# Patient Record
Sex: Female | Born: 1935 | Race: White | Hispanic: No | Smoking: Former smoker
Health system: Southern US, Community
[De-identification: ages and names within clinical notes are randomized; demographics above are authoritative.]

## PROBLEM LIST (undated history)

## (undated) DIAGNOSIS — J449 Chronic obstructive pulmonary disease, unspecified: Secondary | ICD-10-CM

## (undated) DIAGNOSIS — M1990 Unspecified osteoarthritis, unspecified site: Secondary | ICD-10-CM

## (undated) DIAGNOSIS — J45909 Unspecified asthma, uncomplicated: Secondary | ICD-10-CM

## (undated) HISTORY — PX: TONSILLECTOMY: SUR1361

---
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
## 2018-02-25 DIAGNOSIS — F4322 Adjustment disorder with anxiety: Secondary | ICD-10-CM | POA: Insufficient documentation

## 2018-02-25 DIAGNOSIS — I10 Essential (primary) hypertension: Secondary | ICD-10-CM | POA: Insufficient documentation

## 2018-02-25 DIAGNOSIS — K219 Gastro-esophageal reflux disease without esophagitis: Secondary | ICD-10-CM | POA: Insufficient documentation

## 2019-05-21 ENCOUNTER — Emergency Department (HOSPITAL_COMMUNITY): Payer: Medicare Other

## 2019-05-21 ENCOUNTER — Other Ambulatory Visit: Payer: Self-pay

## 2019-05-21 ENCOUNTER — Encounter (HOSPITAL_COMMUNITY): Payer: Self-pay | Admitting: Emergency Medicine

## 2019-05-21 ENCOUNTER — Observation Stay (HOSPITAL_COMMUNITY)
Admission: EM | Admit: 2019-05-21 | Discharge: 2019-05-22 | Disposition: A | Discharge status: Home / Self Care | Payer: Medicare Other | Attending: Internal Medicine | Admitting: Internal Medicine

## 2019-05-21 DIAGNOSIS — J45901 Unspecified asthma with (acute) exacerbation: Secondary | ICD-10-CM | POA: Diagnosis not present

## 2019-05-21 DIAGNOSIS — Z1159 Encounter for screening for other viral diseases: Secondary | ICD-10-CM | POA: Insufficient documentation

## 2019-05-21 DIAGNOSIS — H9190 Unspecified hearing loss, unspecified ear: Secondary | ICD-10-CM | POA: Insufficient documentation

## 2019-05-21 DIAGNOSIS — Z8249 Family history of ischemic heart disease and other diseases of the circulatory system: Secondary | ICD-10-CM | POA: Insufficient documentation

## 2019-05-21 DIAGNOSIS — R42 Dizziness and giddiness: Secondary | ICD-10-CM | POA: Diagnosis present

## 2019-05-21 DIAGNOSIS — F4322 Adjustment disorder with anxiety: Secondary | ICD-10-CM | POA: Diagnosis not present

## 2019-05-21 DIAGNOSIS — J441 Chronic obstructive pulmonary disease with (acute) exacerbation: Secondary | ICD-10-CM | POA: Insufficient documentation

## 2019-05-21 DIAGNOSIS — R110 Nausea: Secondary | ICD-10-CM | POA: Insufficient documentation

## 2019-05-21 DIAGNOSIS — I10 Essential (primary) hypertension: Secondary | ICD-10-CM | POA: Insufficient documentation

## 2019-05-21 DIAGNOSIS — K219 Gastro-esophageal reflux disease without esophagitis: Secondary | ICD-10-CM | POA: Insufficient documentation

## 2019-05-21 DIAGNOSIS — Z87891 Personal history of nicotine dependence: Secondary | ICD-10-CM | POA: Diagnosis not present

## 2019-05-21 DIAGNOSIS — Z7951 Long term (current) use of inhaled steroids: Secondary | ICD-10-CM | POA: Insufficient documentation

## 2019-05-21 DIAGNOSIS — Z79899 Other long term (current) drug therapy: Secondary | ICD-10-CM | POA: Diagnosis not present

## 2019-05-21 HISTORY — DX: Unspecified asthma, uncomplicated: J45.909

## 2019-05-21 HISTORY — DX: Unspecified osteoarthritis, unspecified site: M19.90

## 2019-05-21 LAB — COMPREHENSIVE METABOLIC PANEL
ALT: 12 U/L (ref 0–44)
AST: 19 U/L (ref 15–41)
Albumin: 3.9 g/dL (ref 3.5–5.0)
Alkaline Phosphatase: 48 U/L (ref 38–126)
Anion gap: 11 (ref 5–15)
BUN: 15 mg/dL (ref 8–23)
CO2: 24 mmol/L (ref 22–32)
Calcium: 9.2 mg/dL (ref 8.9–10.3)
Chloride: 102 mmol/L (ref 98–111)
Creatinine, Ser: 0.92 mg/dL (ref 0.44–1.00)
GFR calc Af Amer: >60 mL/min (ref >60)
GFR calc non Af Amer: 57 mL/min — ABNORMAL LOW (ref >60)
Glucose, Bld: 122 mg/dL — ABNORMAL HIGH (ref 70–99)
Potassium: 3.7 mmol/L (ref 3.5–5.1)
Sodium: 137 mmol/L (ref 135–145)
Total Bilirubin: 0.6 mg/dL (ref 0.3–1.2)
Total Protein: 6.7 g/dL (ref 6.5–8.1)

## 2019-05-21 LAB — POCT I-STAT EG7
Bicarbonate: 24.4 mmol/L (ref 20.0–28.0)
Calcium, Ion: 1.23 mmol/L (ref 1.15–1.40)
HCT: 32 % — ABNORMAL LOW (ref 36.0–46.0)
Hemoglobin: 10.9 g/dL — ABNORMAL LOW (ref 12.0–15.0)
O2 Saturation: 86 %
Potassium: 3.8 mmol/L (ref 3.5–5.1)
Sodium: 139 mmol/L (ref 135–145)
TCO2: 26 mmol/L (ref 22–32)
pCO2, Ven: 39.8 mmHg — ABNORMAL LOW (ref 44.0–60.0)
pH, Ven: 7.395 (ref 7.250–7.430)
pO2, Ven: 52 mmHg — ABNORMAL HIGH (ref 32.0–45.0)

## 2019-05-21 LAB — CBC
HCT: 30.3 % — ABNORMAL LOW (ref 36.0–46.0)
Hemoglobin: 10 g/dL — ABNORMAL LOW (ref 12.0–15.0)
MCH: 33.3 pg (ref 26.0–34.0)
MCHC: 33 g/dL (ref 30.0–36.0)
MCV: 101 fL — ABNORMAL HIGH (ref 80.0–100.0)
Platelets: 164 10*3/uL (ref 150–400)
RBC: 3 MIL/uL — ABNORMAL LOW (ref 3.87–5.11)
RDW: 13.3 % (ref 11.5–15.5)
WBC: 10.7 10*3/uL — ABNORMAL HIGH (ref 4.0–10.5)
nRBC: 0 % (ref 0.0–0.2)

## 2019-05-21 LAB — TROPONIN I: Troponin I: <0.03 ng/mL (ref <0.03)

## 2019-05-21 LAB — BRAIN NATRIURETIC PEPTIDE: B Natriuretic Peptide: 95.2 pg/mL (ref 0.0–100.0)

## 2019-05-21 LAB — SARS CORONAVIRUS 2: SARS Coronavirus 2: NOT DETECTED

## 2019-05-21 MED ORDER — ALBUTEROL SULFATE HFA 108 (90 BASE) MCG/ACT IN AERS
4.0000 | INHALATION_SPRAY | Freq: Once | RESPIRATORY_TRACT | Status: AC
  Administered 2019-05-21: 4 via RESPIRATORY_TRACT
  Filled 2019-05-21: qty 6.7

## 2019-05-21 MED ORDER — METHYLPREDNISOLONE SODIUM SUCC 125 MG IJ SOLR
125.0000 mg | Freq: Once | INTRAMUSCULAR | Status: AC
  Administered 2019-05-21: 125 mg via INTRAVENOUS
  Filled 2019-05-21: qty 2

## 2019-05-21 MED ORDER — MECLIZINE HCL 25 MG PO TABS
25.0000 mg | ORAL_TABLET | Freq: Once | ORAL | Status: AC
  Administered 2019-05-21: 21:00:00 25 mg via ORAL
  Filled 2019-05-21: qty 1

## 2019-05-21 MED ORDER — AEROCHAMBER PLUS FLO-VU MISC
1.0000 | Freq: Once | Status: AC
  Administered 2019-05-22: 1
  Filled 2019-05-21: qty 1

## 2019-05-21 NOTE — ED Triage Notes (Signed)
Pt to ED via [facility] after reported having sudden onset of dizziness and nausea.  Pt st's she ate dinner then went next door to her neighbors where she became dizzy and nauseated.  Pt has had dry heaves but has not vomited.  EMS gave pt Zofran 4mg  IV

## 2019-05-21 NOTE — ED Provider Notes (Signed)
Medical screening examination/treatment/procedure(s) were conducted as a shared visit with non-physician practitioner(s) and myself.  I personally evaluated the patient during the encounter.  EKG Interpretation  Date/Time:  Saturday May 21 2019 19:52:22 EDT Ventricular Rate:  77 PR Interval:    QRS Duration: 100 QT Interval:  410 QTC Calculation: 464 R Axis:   55 Text Interpretation:  Sinus rhythm NORMAL, NO OLD COMPARISON Confirmed by Charlesetta Shanks [phone]) on 05/21/2019 8:32:13 PM  Positional dizziness with nausea sudden onset, worse with position change, no H/A and no visual disturbance.  PE normal neuro exam, alert and no distress. Heart regular, lungs occasional wheeze with no resp distress.  Agree with plan.   Charlesetta Shanks, MD 05/28/19 [phone]

## 2019-05-21 NOTE — ED Provider Notes (Signed)
[facility] EMERGENCY DEPARTMENT Provider Note   CSN: [id number] Arrival date & time: 05/21/19  1948    History   Chief Complaint Chief Complaint  Patient presents with  . Nausea  . Dizziness    HPI Zyanya Wormald is a 85 y.o. female.     [place] is a 85 y.o. female with a history of COPD, asthma and arthritis, who presents via EMS for evaluation of dizziness and nausea.  Patient reports that she walked across the street to have a drink with her neighbor after dinner, she went to stand up from her chair at her neighbor's house and had sudden onset of dizziness, when she tried to stand she fell back into her chair 3 times, did not hit her head or injure herself.  She reports with onset of dizziness he also felt incredibly nauseous, she has had dry heaving but no episodes of vomiting, Zofran given with EMS.  She reports dizziness is made worse with certain position changes, and when she looks down dizziness seems to be improved.  She reports she felt off balance but also somewhat lightheaded.  Denies associated chest pain or shortness of breath, has not had lower extremity swelling.  She denies any abdominal pain associated with nausea, no diarrhea or blood in her stools.  She has not had any fevers or chills or recent illness.  Reports she has some element of chronic shortness of breath related to her COPD and asthma and has a chronic occasional cough which is unchanged.  No known sick contacts.  Patient does not wear oxygen chronically at home.  EMS placed patient on oxygen to help with nausea and dizziness primarily, this was not due to hypoxic episode.  Patient has not taken anything to treat her symptoms prior to arrival aside from Zofran given with EMS.  Denies prior history of similar dizziness episodes.  Denies associated headache, vision changes, facial asymmetry, numbness weakness or tingling in any extremities.     Past Medical History:  Diagnosis  Date  . Arthritis   . Asthma   . COPD (chronic obstructive pulmonary disease) [facility])     Patient Active Problem List   Diagnosis Date Noted  . Acute asthma exacerbation 05/21/2019  . Adjustment disorder with anxious mood 02/25/2018  . Essential hypertension 02/25/2018  . GERD (gastroesophageal reflux disease) 02/25/2018    Past Surgical History:  Procedure Laterality Date  . TONSILLECTOMY       OB History   No obstetric history on file.      Home Medications    Prior to Admission medications   Medication Sig Start Date End Date Taking? Authorizing Provider  meclizine (ANTIVERT) 25 MG tablet Take 1 tablet (25 mg total) by mouth 3 (three) times daily as needed for dizziness. 05/22/19   Dartha Lodge, PA-C  predniSONE (DELTASONE) 20 MG tablet Take 3 tablets (60 mg total) by mouth daily for 5 days. 05/22/19 05/27/19  Dartha Lodge, PA-C    Family History Family History  Problem Relation Age of Onset  . Heart disease Mother   . Stroke Father     Social History Social History   Tobacco Use  . Smoking status: Former Games developer  . Smokeless tobacco: Never Used  Substance Use Topics  . Alcohol use: Yes  . Drug use: Never     Allergies   Patient has no allergy information on record.   Review of Systems Review of Systems  Constitutional: Negative for chills  and fever.  HENT: Negative.   Eyes: Negative for pain and visual disturbance.  Respiratory: Negative for cough and shortness of breath.   Cardiovascular: Negative for chest pain and leg swelling.  Gastrointestinal: Positive for nausea. Negative for abdominal pain, constipation, diarrhea and vomiting.  Genitourinary: Negative for dysuria and frequency.  Musculoskeletal: Negative for arthralgias, back pain, myalgias and neck pain.  Skin: Negative for color change and rash.  Neurological: Positive for dizziness and light-headedness. Negative for seizures, syncope, facial asymmetry, speech difficulty, weakness,  numbness and headaches.     Physical Exam Updated Vital Signs BP (!) 133/51 (BP Location: Right Arm)   Pulse 81   Temp (!) 97.1 F (36.2 C) (Axillary)   Resp (!) 28   Ht 5' (1.524 m)   Wt 59 kg   SpO2 98%   BMI 25.39 kg/m   Physical Exam Vitals signs and nursing note reviewed.  Constitutional:      General: She is not in acute distress.    Appearance: Normal appearance. She is well-developed and normal weight. She is not ill-appearing or diaphoretic.  HENT:     Head: Normocephalic and atraumatic.     Nose: Nose normal.     Mouth/Throat:     Mouth: Mucous membranes are moist.     Pharynx: Oropharynx is clear.  Eyes:     General:        Right eye: No discharge.        Left eye: No discharge.     Extraocular Movements: Extraocular movements intact.     Pupils: Pupils are equal, round, and reactive to light.     Comments: No nystagmus noted.  Neck:     Musculoskeletal: Neck supple.  Cardiovascular:     Rate and Rhythm: Normal rate and regular rhythm.     Pulses: Normal pulses.          Radial pulses are 2+ on the right side and 2+ on the left side.       Dorsalis pedis pulses are 2+ on the right side and 2+ on the left side.     Heart sounds: Normal heart sounds. No murmur. No friction rub. No gallop.   Pulmonary:     Effort: Pulmonary effort is normal. No respiratory distress.     Breath sounds: Wheezing present. No rales.     Comments: Patient breathing comfortably on room air completing full sentences, on auscultation patient does have some wheezing present in bilateral lung fields. Abdominal:     General: Bowel sounds are normal. There is no distension.     Palpations: Abdomen is soft. There is no mass.     Tenderness: There is no abdominal tenderness. There is no guarding.     Comments: Abdomen soft, nondistended, nontender to palpation in all quadrants without guarding or peritoneal signs  Musculoskeletal:        General: No deformity.     Right lower leg: No  edema.     Left lower leg: No edema.     Comments: Bilateral lower extremities warm and well-perfused without edema or tenderness.  Skin:    General: Skin is warm and dry.     Capillary Refill: Capillary refill takes less than 2 seconds.  Neurological:     Mental Status: She is alert.     Coordination: Coordination normal.     Comments: Speech is clear, able to follow commands CN III-XII intact Normal strength in upper and lower extremities bilaterally including dorsiflexion and  plantar flexion, strong and equal grip strength Sensation normal to light and sharp touch Moves extremities without ataxia, coordination intact Normal finger to nose and rapid alternating movements No pronator drift  Psychiatric:        Mood and Affect: Mood normal.        Behavior: Behavior normal.      ED Treatments / Results  Labs (all labs ordered are listed, but only abnormal results are displayed) Labs Reviewed  COMPREHENSIVE METABOLIC PANEL - Abnormal; Notable for the following components:      Result Value   Glucose, Bld 122 (*)    GFR calc non Af Amer 57 (*)    All other components within normal limits  CBC - Abnormal; Notable for the following components:   WBC 10.7 (*)    RBC 3.00 (*)    Hemoglobin 10.0 (*)    HCT 30.3 (*)    MCV 101.0 (*)    All other components within normal limits  POCT I-STAT EG7 - Abnormal; Notable for the following components:   pCO2, Ven 39.8 (*)    pO2, Ven 52.0 (*)    HCT 32.0 (*)    Hemoglobin 10.9 (*)    All other components within normal limits  SARS CORONAVIRUS 2  TROPONIN I  BRAIN NATRIURETIC PEPTIDE    EKG EKG Interpretation  Date/Time:  Saturday May 21 2019 19:52:22 EDT Ventricular Rate:  77 PR Interval:    QRS Duration: 100 QT Interval:  410 QTC Calculation: 464 R Axis:   55 Text Interpretation:  Sinus rhythm NORMAL, NO OLD COMPARISON Confirmed by Charlesetta Shanks [phone]) on 05/21/2019 8:32:13 PM   Radiology Dg Chest Port 1 View   Result Date: 05/21/2019 CLINICAL DATA:  Hypoxia EXAM: PORTABLE CHEST 1 VIEW COMPARISON:  None. FINDINGS: The heart size is borderline enlarged. There is blunting of the costophrenic angles bilaterally. There are vague bibasilar airspace opacities. The lungs appear somewhat hyperexpanded. There is no pneumothorax. IMPRESSION: 1. Borderline enlarged heart. 2. Blunting of the costophrenic angles bilaterally may be secondary to trace bilateral pleural effusions and/or atelectasis. Electronically Signed   By: Constance Holster M.D.   On: 05/21/2019 21:18    Procedures Procedures (including critical care time)  Medications Ordered in ED Medications  aerochamber plus with mask device 1 each (has no administration in time range)  meclizine (ANTIVERT) tablet 25 mg (25 mg Oral Given 05/21/19 2119)  albuterol (VENTOLIN HFA) 108 (90 Base) MCG/ACT inhaler 4 puff (4 puffs Inhalation Given 05/21/19 2119)  methylPREDNISolone sodium succinate (SOLU-MEDROL) 125 mg/2 mL injection 125 mg (125 mg Intravenous Given 05/21/19 2121)     Initial Impression / Assessment and Plan / ED Course  I have reviewed the triage vital signs and the nursing notes.  Pertinent labs & imaging results that were available during my care of the patient were reviewed by me and considered in my medical decision making (see chart for details).  Patient presents to the emergency department for evaluation of dizziness and nausea, symptoms were sudden onset, dizziness is worse with position change.  Patient has no other associated neurologic symptoms and normal neurologic exam without deficit on arrival.  No prior history of stroke or vertigo.  No associated chest pain or shortness of breath, no lower extremity edema.  No abdominal pain, no melena or hematochezia.  Symptoms may be positional vertigo versus near syncope.  Given reassuring neurologic exam do not feel that MRI is indicated at this time.  Will give IV fluids  and meclizine and  reevaluate.  Patient does have some slight wheezing on exam, history of COPD and asthma.  Just after evaluating patient was notified by nursing that patient desatted to 70%, I witnessed this myself, with good flow on pulse ox.  Patient reported that she felt a little bit short of breath and lightheaded with this, became slightly tachypneic.  Patient continues to have some wheezing on exam.  Patient placed on 2.5 L nasal cannula with significant improvement in O2 saturations, now greater than 90%, will give nebulizer treatments and Solu-Medrol and reevaluate.  We will also check labs, VBG, troponin, BNP, chest x-ray and EKG.   VBG reassuring patient is not significantly hypercarbic with normal pH.  EKG without ischemic changes and troponin negative.  Slight leukocytosis 10.7, stable hemoglobin, no acute electrolyte derangements, normal renal and liver function.  BNP is not elevated.  Coronavirus test negative.  Chest x-ray shows borderline cardiomegaly, there is some blunting of the costophrenic angles which could be related to atelectasis versus trace pleural effusions, patient does not have signs of significant fluid overload on exam.  Initially consult Dr. Allena Katz with [facility] for admission given dizziness versus near syncope and hypoxia, he saw and evaluated patient in consult, patient was taken off of oxygen and seems to be maintaining appropriate O2 saturations on room air and dizziness has resolved with meclizine.  As long as patient is ambulatory in the department with stable oxygen saturations feel she can likely go home with outpatient treatment and PCP follow-up.  Will ambulate patient, I feel this plan is reasonable.  Patient ambulated in the department maintaining O2 saturation greater than 93% and reports she is feeling much better, dizziness has resolved, no further nausea.  Likely BPPV, remains neurologically intact.  Lungs are clear after steroids and albuterol.  Patient is in  agreement with plan for discharge home, will send patient home on steroid burst, meclizine as needed for dizziness, strict return precautions discussed and she expresses understanding and agreement with this plan.  Discharged home in good condition.  Final Clinical Impressions(s) / ED Diagnoses   Final diagnoses:  COPD with acute exacerbation (HCC)  Dizziness    ED Discharge Orders         Ordered    predniSONE (DELTASONE) 20 MG tablet  Daily     05/22/19 0035    meclizine (ANTIVERT) 25 MG tablet  3 times daily PRN     05/22/19 0035           Dartha Lodge, PA-C 05/24/19 1536    Arby Barrette, MD 05/28/19 [phone]

## 2019-05-21 NOTE — Consult Note (Signed)
History and Physical    Theresa Landry ZOX:[id number] DOB: 08/15/1933 DOA: 05/21/2019  PCP: Loyal Jacobson, MD  Patient coming from: Friend's house  I have personally briefly reviewed patient's old medical records in [facility]  Chief Complaint: Dizziness and nausea  HPI: Theresa Landry is a 85 y.o. female with medical history significant for asthma, chronic bronchitis and bronchiectasis, tension, GERD, and adjustment disorder with anxious mood who presents to the ED for evaluation of dizziness and nausea.  Patient states she was sitting down at her friend's house when she stood up quickly and all of a sudden had dizziness described as a feeling of off balance with a room spinning sensation.  She also reported feeling nauseous but did not have emesis, just dry heaves.  The symptoms were transient and have not recurred after receiving Zofran by EMS and meclizine in the ED.  She reports chronic asthma/COPD and bronchitis which is unchanged.  She denies any chest pain, palpitations, loss of consciousness, abdominal pain, dysuria, or fall.  ED Course:  Initial vitals showed BP 133/51, pulse 80, RR 20, temp 97.1 Fahrenheit, SPO2 reportedly decreased to 70s on room air per EDP (not documented under vitals) and improved with 2 L supplemental O2 via [place].  Labs are notable for WBC 10.7, hemoglobin 10.0, platelets 164,000, potassium 3.7, BUN 15, creatinine 0.92, serum glucose 122, BNP 95.2, troponin I <0.03.  VBG showed pH 7.395, PCO2 39.8, PO2 52.  SARS-CoV-2 test was obtained and negative.  Portable chest x-ray showed slight blunting of the costophrenic angles bilaterally suggestive of atelectasis versus trace pleural effusion, no focal consolidation or pulmonary edema noted.  Patient was given oral meclizine, albuterol inhaler, and IV Solu-Medrol 125 mg once with continued supplemental oxygen requirement.  The hospitalist service was consulted to admit for further management.  Review of  Systems: All systems reviewed and are negative except as documented in history of present illness above.   Past Medical History:  Diagnosis Date  . Arthritis   . Asthma   . COPD (chronic obstructive pulmonary disease) (HCC)     Past Surgical History:  Procedure Laterality Date  . TONSILLECTOMY      Social History:  reports that she has quit smoking. She has never used smokeless tobacco. She reports current alcohol use. She reports that she does not use drugs.  Not on File  Family History  Problem Relation Age of Onset  . Heart disease Mother   . Stroke Father      Prior to Admission medications   Not on File    Physical Exam: Vitals:   05/21/19 1949 05/21/19 1955 05/21/19 1957  BP:  (!) 133/51   Pulse:  81   Resp:  (!) 28   Temp: (!) 97.5 F (36.4 C) (!) 97.1 F (36.2 C)   TempSrc: Axillary Axillary   SpO2:  98%   Weight:   59 kg  Height:   5' (1.524 m)    Constitutional: Elderly woman resting supine in bed, NAD, calm, comfortable.  Somewhat hard of hearing. Eyes: PERRL, lids and conjunctivae normal. EOMI without nystagmus. ENMT: Mucous membranes are moist. Posterior pharynx clear of any exudate or lesions.Normal dentition.  Neck: normal, supple, no masses. Respiratory: Distant breath sounds with faint expiratory wheezing on exam. Normal respiratory effort. No accessory muscle use.  Cardiovascular: Regular rate and rhythm, no murmurs / rubs / gallops. No extremity edema. 2+ pedal pulses. Abdomen: no tenderness, no masses palpated. No hepatosplenomegaly. Bowel sounds positive.  Musculoskeletal: no clubbing / cyanosis. No joint deformity upper and lower extremities. Good ROM, no contractures. Normal muscle tone.  Skin: no rashes, lesions, ulcers. No induration Neurologic: CN 2-12 grossly intact. Sensation intact, Strength 5/5 in all 4.  No nystagmus or dizziness elicited with provoking maneuver. Psychiatric: Normal judgment and insight. Alert and oriented x 3.  Normal mood.   Labs on Admission: I have personally reviewed following labs and imaging studies  CBC: Recent Labs  Lab 05/21/19 2055 05/21/19 2133  WBC 10.7*  --   HGB 10.0* 10.9*  HCT 30.3* 32.0*  MCV 101.0*  --   PLT 164  --    Basic Metabolic Panel: Recent Labs  Lab 05/21/19 2055 05/21/19 2133  NA 137 139  K 3.7 3.8  CL 102  --   CO2 24  --   GLUCOSE 122*  --   BUN 15  --   CREATININE 0.92  --   CALCIUM 9.2  --    GFR: Estimated Creatinine Clearance: 36.6 mL/min (by C-G formula based on SCr of 0.92 mg/dL). Liver Function Tests: Recent Labs  Lab 05/21/19 2055  AST 19  ALT 12  ALKPHOS 48  BILITOT 0.6  PROT 6.7  ALBUMIN 3.9   No results for input(s): LIPASE, AMYLASE in the last 168 hours. No results for input(s): AMMONIA in the last 168 hours. Coagulation Profile: No results for input(s): INR, PROTIME in the last 168 hours. Cardiac Enzymes: Recent Labs  Lab 05/21/19 2055  TROPONINI <0.03   BNP (last 3 results) No results for input(s): PROBNP in the last 8760 hours. HbA1C: No results for input(s): HGBA1C in the last 72 hours. CBG: No results for input(s): GLUCAP in the last 168 hours. Lipid Profile: No results for input(s): CHOL, HDL, LDLCALC, TRIG, CHOLHDL, LDLDIRECT in the last 72 hours. Thyroid Function Tests: No results for input(s): TSH, T4TOTAL, FREET4, T3FREE, THYROIDAB in the last 72 hours. Anemia Panel: No results for input(s): VITAMINB12, FOLATE, FERRITIN, TIBC, IRON, RETICCTPCT in the last 72 hours. Urine analysis: No results found for: COLORURINE, APPEARANCEUR, LABSPEC, [place], GLUCOSEU, HGBUR, BILIRUBINUR, KETONESUR, PROTEINUR, UROBILINOGEN, NITRITE, LEUKOCYTESUR  Radiological Exams on Admission: Dg Chest Port 1 View  Result Date: 05/21/2019 CLINICAL DATA:  Hypoxia EXAM: PORTABLE CHEST 1 VIEW COMPARISON:  None. FINDINGS: The heart size is borderline enlarged. There is blunting of the costophrenic angles bilaterally. There are vague  bibasilar airspace opacities. The lungs appear somewhat hyperexpanded. There is no pneumothorax. IMPRESSION: 1. Borderline enlarged heart. 2. Blunting of the costophrenic angles bilaterally may be secondary to trace bilateral pleural effusions and/or atelectasis. Electronically Signed   By: Constance Holster M.D.   On: 05/21/2019 21:18    EKG: Independently reviewed.  Normal sinus rhythm, no acute ischemic changes, no prior for comparison.  Assessment/Plan Active Problems:   Acute asthma exacerbation   Adjustment disorder with anxious mood   Essential hypertension   GERD (gastroesophageal reflux disease)  Theresa Landry is a 85 y.o. female with medical history significant for asthma, chronic bronchitis and bronchiectasis, tension, GERD, and adjustment disorder with anxious mood who presented to the ED with transient episode of dizziness on standing and nausea without emesis.  Dizziness and nausea: Transient episode at home without loss of consciousness or fall.  Symptoms improved after receiving Zofran and meclizine.  She has no focal neuro deficits on my examination or nystagmus.  No dizziness or nystagmus elicited with provoking maneuvers.  She is currently feeling better and feels comfortable returning home.  At this time I do not see an indication for admission.  Can consider checking orthostatic vital signs and ambulating with pulse ox for further evaluation.  Asthma: Patient with asthma and chronic bronchitis following with [facility] pulmonology.  She uses Symbicort, Singulair, and as needed duo nebs and albuterol inhalers and denies any recent worsening shortness of breath.  She reportedly was hypoxic with saturation in the 70s on room air in the ED.  She received IV Solu-Medrol 125 mg once and an albuterol inhaler treatment.  On my examination she was saturating >96% on room air after initial treatment without further respiratory symptoms.  As above, recommend  ambulating with pulse ox to determine oxygenation status.  Otherwise, at this time, there is no indication to admit.  Patient feels comfortable returning home and continuing her home asthma regimen.   Darreld Mclean MD [facility]  If 7PM-7AM, please contact night-coverage [email or web address]  05/21/2019, 11:32 PM

## 2019-05-22 DIAGNOSIS — R42 Dizziness and giddiness: Secondary | ICD-10-CM | POA: Diagnosis not present

## 2019-05-22 MED ORDER — PREDNISONE 20 MG PO TABS: 60.0000 mg | ORAL_TABLET | Freq: Every day | ORAL | 0 refills | Status: AC

## 2019-05-22 MED ORDER — MECLIZINE HCL 25 MG PO TABS: 25.0000 mg | ORAL_TABLET | Freq: Three times a day (TID) | ORAL | 0 refills | Status: AC | PRN

## 2019-05-22 NOTE — ED Notes (Signed)
Pt attempting to find a ride home at this time.

## 2019-05-22 NOTE — ED Notes (Signed)
Pt ambulated in the hall with sat's 93-94%

## 2019-05-22 NOTE — Discharge Instructions (Addendum)
Please take prednisone for the next 5 days to help treat your asthma and COPD.  Use your albuterol inhaler every 4 hours for the next 24 hours and then as needed and take all your other COPD and asthmatic medications as directed.  Dizziness please take meclizine as needed.  If dizziness persists, you have persistent vomiting, develop headache, vision changes, numbness or weakness please return to the emergency department.  If you have increasing shortness of breath, chest pain, fevers or any other new or concerning symptoms return to the ED.

## 2020-07-09 ENCOUNTER — Inpatient Hospital Stay (HOSPITAL_COMMUNITY)
Admission: EM | Admit: 2020-07-09 | Discharge: 2020-07-17 | DRG: 041 | Disposition: A | Discharge status: Skilled Nursing Facility | Payer: Medicare Other | Attending: Student in an Organized Health Care Education/Training Program | Admitting: Student in an Organized Health Care Education/Training Program

## 2020-07-09 ENCOUNTER — Other Ambulatory Visit: Payer: Self-pay

## 2020-07-09 ENCOUNTER — Emergency Department (HOSPITAL_COMMUNITY): Payer: Medicare Other

## 2020-07-09 ENCOUNTER — Encounter (HOSPITAL_COMMUNITY): Payer: Self-pay | Admitting: Emergency Medicine

## 2020-07-09 DIAGNOSIS — I10 Essential (primary) hypertension: Secondary | ICD-10-CM | POA: Diagnosis present

## 2020-07-09 DIAGNOSIS — G9349 Other encephalopathy: Secondary | ICD-10-CM | POA: Diagnosis present

## 2020-07-09 DIAGNOSIS — I63412 Cerebral infarction due to embolism of left middle cerebral artery: Principal | ICD-10-CM | POA: Diagnosis present

## 2020-07-09 DIAGNOSIS — H5347 Heteronymous bilateral field defects: Secondary | ICD-10-CM | POA: Diagnosis present

## 2020-07-09 DIAGNOSIS — K219 Gastro-esophageal reflux disease without esophagitis: Secondary | ICD-10-CM | POA: Diagnosis present

## 2020-07-09 DIAGNOSIS — R29707 NIHSS score 7: Secondary | ICD-10-CM | POA: Diagnosis present

## 2020-07-09 DIAGNOSIS — Z79899 Other long term (current) drug therapy: Secondary | ICD-10-CM | POA: Diagnosis not present

## 2020-07-09 DIAGNOSIS — M1990 Unspecified osteoarthritis, unspecified site: Secondary | ICD-10-CM | POA: Diagnosis present

## 2020-07-09 DIAGNOSIS — H903 Sensorineural hearing loss, bilateral: Secondary | ICD-10-CM | POA: Diagnosis present

## 2020-07-09 DIAGNOSIS — F419 Anxiety disorder, unspecified: Secondary | ICD-10-CM | POA: Diagnosis present

## 2020-07-09 DIAGNOSIS — Z823 Family history of stroke: Secondary | ICD-10-CM

## 2020-07-09 DIAGNOSIS — Z7951 Long term (current) use of inhaled steroids: Secondary | ICD-10-CM

## 2020-07-09 DIAGNOSIS — G8191 Hemiplegia, unspecified affecting right dominant side: Secondary | ICD-10-CM | POA: Diagnosis present

## 2020-07-09 DIAGNOSIS — I440 Atrioventricular block, first degree: Secondary | ICD-10-CM | POA: Diagnosis present

## 2020-07-09 DIAGNOSIS — I63512 Cerebral infarction due to unspecified occlusion or stenosis of left middle cerebral artery: Secondary | ICD-10-CM | POA: Diagnosis not present

## 2020-07-09 DIAGNOSIS — Z8249 Family history of ischemic heart disease and other diseases of the circulatory system: Secondary | ICD-10-CM

## 2020-07-09 DIAGNOSIS — R4701 Aphasia: Secondary | ICD-10-CM | POA: Diagnosis present

## 2020-07-09 DIAGNOSIS — Z20822 Contact with and (suspected) exposure to covid-19: Secondary | ICD-10-CM | POA: Diagnosis present

## 2020-07-09 DIAGNOSIS — R410 Disorientation, unspecified: Secondary | ICD-10-CM | POA: Diagnosis not present

## 2020-07-09 DIAGNOSIS — M069 Rheumatoid arthritis, unspecified: Secondary | ICD-10-CM | POA: Diagnosis present

## 2020-07-09 DIAGNOSIS — Z79890 Hormone replacement therapy (postmenopausal): Secondary | ICD-10-CM

## 2020-07-09 DIAGNOSIS — Z8673 Personal history of transient ischemic attack (TIA), and cerebral infarction without residual deficits: Secondary | ICD-10-CM

## 2020-07-09 DIAGNOSIS — Z87891 Personal history of nicotine dependence: Secondary | ICD-10-CM

## 2020-07-09 DIAGNOSIS — I480 Paroxysmal atrial fibrillation: Secondary | ICD-10-CM | POA: Diagnosis present

## 2020-07-09 DIAGNOSIS — J449 Chronic obstructive pulmonary disease, unspecified: Secondary | ICD-10-CM | POA: Diagnosis present

## 2020-07-09 DIAGNOSIS — R569 Unspecified convulsions: Secondary | ICD-10-CM | POA: Diagnosis not present

## 2020-07-09 DIAGNOSIS — E039 Hypothyroidism, unspecified: Secondary | ICD-10-CM | POA: Diagnosis present

## 2020-07-09 DIAGNOSIS — I639 Cerebral infarction, unspecified: Secondary | ICD-10-CM | POA: Diagnosis present

## 2020-07-09 DIAGNOSIS — I629 Nontraumatic intracranial hemorrhage, unspecified: Secondary | ICD-10-CM | POA: Diagnosis present

## 2020-07-09 DIAGNOSIS — I619 Nontraumatic intracerebral hemorrhage, unspecified: Secondary | ICD-10-CM | POA: Diagnosis present

## 2020-07-09 DIAGNOSIS — E785 Hyperlipidemia, unspecified: Secondary | ICD-10-CM | POA: Diagnosis present

## 2020-07-09 DIAGNOSIS — I6389 Other cerebral infarction: Secondary | ICD-10-CM | POA: Diagnosis not present

## 2020-07-09 DIAGNOSIS — G3184 Mild cognitive impairment, so stated: Secondary | ICD-10-CM | POA: Diagnosis present

## 2020-07-09 LAB — COMPREHENSIVE METABOLIC PANEL
ALT: 12 U/L (ref 0–44)
AST: 18 U/L (ref 15–41)
Albumin: 3.8 g/dL (ref 3.5–5.0)
Alkaline Phosphatase: 57 U/L (ref 38–126)
Anion gap: 10 (ref 5–15)
BUN: 11 mg/dL (ref 8–23)
CO2: 23 mmol/L (ref 22–32)
Calcium: 9.5 mg/dL (ref 8.9–10.3)
Chloride: 103 mmol/L (ref 98–111)
Creatinine, Ser: 0.72 mg/dL (ref 0.44–1.00)
GFR calc Af Amer: >60 mL/min (ref >60)
GFR calc non Af Amer: >60 mL/min (ref >60)
Glucose, Bld: 103 mg/dL — ABNORMAL HIGH (ref 70–99)
Potassium: 3.8 mmol/L (ref 3.5–5.1)
Sodium: 136 mmol/L (ref 135–145)
Total Bilirubin: 1.1 mg/dL (ref 0.3–1.2)
Total Protein: 7.4 g/dL (ref 6.5–8.1)

## 2020-07-09 LAB — POCT I-STAT, CHEM 8
BUN: 11 mg/dL (ref 8–23)
Calcium, Ion: 1.15 mmol/L (ref 1.15–1.40)
Chloride: 102 mmol/L (ref 98–111)
Creatinine, Ser: 0.6 mg/dL (ref 0.44–1.00)
Glucose, Bld: 101 mg/dL — ABNORMAL HIGH (ref 70–99)
HCT: 38 % (ref 36.0–46.0)
Hemoglobin: 12.9 g/dL (ref 12.0–15.0)
Potassium: 4 mmol/L (ref 3.5–5.1)
Sodium: 139 mmol/L (ref 135–145)
TCO2: 23 mmol/L (ref 22–32)

## 2020-07-09 LAB — DIFFERENTIAL
Abs Immature Granulocytes: 0.02 10*3/uL (ref 0.00–0.07)
Basophils Absolute: 0.1 10*3/uL (ref 0.0–0.1)
Basophils Relative: 1 %
Eosinophils Absolute: 1 10*3/uL — ABNORMAL HIGH (ref 0.0–0.5)
Eosinophils Relative: 10 %
Immature Granulocytes: 0 %
Lymphocytes Relative: 38 %
Lymphs Abs: 3.9 10*3/uL (ref 0.7–4.0)
Monocytes Absolute: 0.7 10*3/uL (ref 0.1–1.0)
Monocytes Relative: 7 %
Neutro Abs: 4.4 10*3/uL (ref 1.7–7.7)
Neutrophils Relative %: 44 %

## 2020-07-09 LAB — CBC
HCT: 36.7 % (ref 36.0–46.0)
Hemoglobin: 11.9 g/dL — ABNORMAL LOW (ref 12.0–15.0)
MCH: 33.1 pg (ref 26.0–34.0)
MCHC: 32.4 g/dL (ref 30.0–36.0)
MCV: 101.9 fL — ABNORMAL HIGH (ref 80.0–100.0)
Platelets: 225 10*3/uL (ref 150–400)
RBC: 3.6 MIL/uL — ABNORMAL LOW (ref 3.87–5.11)
RDW: 14.3 % (ref 11.5–15.5)
WBC: 10.2 10*3/uL (ref 4.0–10.5)
nRBC: 0 % (ref 0.0–0.2)

## 2020-07-09 LAB — PROTIME-INR
INR: 1.1 (ref 0.8–1.2)
Prothrombin Time: 13.4 seconds (ref 11.4–15.2)

## 2020-07-09 LAB — APTT: aPTT: 27 seconds (ref 24–36)

## 2020-07-09 LAB — SARS CORONAVIRUS 2 BY RT PCR (HOSPITAL ORDER, PERFORMED IN ~~LOC~~ HOSPITAL LAB): SARS Coronavirus 2: NEGATIVE

## 2020-07-09 LAB — CBG MONITORING, ED: Glucose-Capillary: 89 mg/dL (ref 70–99)

## 2020-07-09 MED ORDER — IOHEXOL 350 MG/ML SOLN
100.0000 mL | Freq: Once | INTRAVENOUS | Status: AC | PRN
  Administered 2020-07-09: 100 mL via INTRAVENOUS

## 2020-07-09 MED ORDER — LEVOTHYROXINE SODIUM 25 MCG PO TABS
25.0000 ug | ORAL_TABLET | Freq: Every morning | ORAL | Status: DC
  Administered 2020-07-11 – 2020-07-17 (×7): 25 ug via ORAL
  Filled 2020-07-09 (×8): qty 1

## 2020-07-09 MED ORDER — LACTATED RINGERS IV BOLUS
500.0000 mL | Freq: Once | INTRAVENOUS | Status: AC
  Administered 2020-07-09: 500 mL via INTRAVENOUS

## 2020-07-09 MED ORDER — DILTIAZEM HCL ER COATED BEADS 240 MG PO CP24: 240.0000 mg | ORAL_CAPSULE | Freq: Every day | ORAL | Status: DC

## 2020-07-09 MED ORDER — MOMETASONE FURO-FORMOTEROL FUM 200-5 MCG/ACT IN AERO
2.0000 | INHALATION_SPRAY | Freq: Two times a day (BID) | RESPIRATORY_TRACT | Status: DC
  Administered 2020-07-10 – 2020-07-17 (×14): 2 via RESPIRATORY_TRACT
  Filled 2020-07-09: qty 8.8

## 2020-07-09 MED ORDER — SENNOSIDES-DOCUSATE SODIUM 8.6-50 MG PO TABS
1.0000 | ORAL_TABLET | Freq: Every evening | ORAL | Status: DC | PRN
  Administered 2020-07-15: 1 via ORAL
  Filled 2020-07-09: qty 1

## 2020-07-09 MED ORDER — MECLIZINE HCL 12.5 MG PO TABS: 25.0000 mg | ORAL_TABLET | Freq: Three times a day (TID) | ORAL | Status: DC | PRN

## 2020-07-09 MED ORDER — ONDANSETRON HCL 4 MG PO TABS: 4.0000 mg | ORAL_TABLET | Freq: Four times a day (QID) | ORAL | Status: DC | PRN

## 2020-07-09 MED ORDER — SODIUM CHLORIDE 0.9% FLUSH
3.0000 mL | Freq: Once | INTRAVENOUS | Status: AC
Start: 2020-07-09 — End: 2020-07-09
  Administered 2020-07-09: 3 mL via INTRAVENOUS

## 2020-07-09 MED ORDER — KCL-LACTATED RINGERS-D5W 20 MEQ/L IV SOLN
INTRAVENOUS | Status: AC
  Filled 2020-07-09 (×2): qty 1000

## 2020-07-09 MED ORDER — ONDANSETRON HCL 4 MG/2ML IJ SOLN: 4.0000 mg | Freq: Four times a day (QID) | INTRAMUSCULAR | Status: DC | PRN

## 2020-07-09 MED ORDER — PANTOPRAZOLE SODIUM 20 MG PO TBEC
20.0000 mg | DELAYED_RELEASE_TABLET | Freq: Every day | ORAL | Status: DC
  Administered 2020-07-10 – 2020-07-17 (×8): 20 mg via ORAL
  Filled 2020-07-09 (×8): qty 1

## 2020-07-09 MED ORDER — ENOXAPARIN SODIUM 40 MG/0.4ML ~~LOC~~ SOLN: 40.0000 mg | SUBCUTANEOUS | Status: DC

## 2020-07-09 MED ORDER — ACETAMINOPHEN 650 MG RE SUPP: 650.0000 mg | Freq: Four times a day (QID) | RECTAL | Status: DC | PRN

## 2020-07-09 MED ORDER — ACETAMINOPHEN 325 MG PO TABS: 650.0000 mg | ORAL_TABLET | Freq: Four times a day (QID) | ORAL | Status: DC | PRN

## 2020-07-09 MED ORDER — ALBUTEROL SULFATE (2.5 MG/3ML) 0.083% IN NEBU: 3.0000 mL | INHALATION_SOLUTION | Freq: Four times a day (QID) | RESPIRATORY_TRACT | Status: DC | PRN

## 2020-07-09 NOTE — ED Notes (Signed)
Molli Hazard, son, [phone] would like an update when available

## 2020-07-09 NOTE — ED Notes (Signed)
Pt in MRI.

## 2020-07-09 NOTE — Progress Notes (Signed)
EEG complete - results pending 

## 2020-07-09 NOTE — ED Triage Notes (Signed)
Arrived via EMS from home as a CODE STROKE. Slurred speech and right side weakness.

## 2020-07-09 NOTE — ED Provider Notes (Addendum)
[facility] EMERGENCY DEPARTMENT Provider Note   CSN: [id number] Arrival date & time: 07/09/20  0865  An emergency department physician performed an initial assessment on this suspected stroke patient at 1632.  History Chief Complaint  Patient presents with  . Code Stroke    Theresa Landry is a 86 y.o. female.  Patient is a 86 year old female with a history of COPD and hypertension who presents as a code stroke. She was seen by a neighbor this morning around 8:00 who talked to her briefly but did not really ascertain whether she was at baseline or not. Another neighbor visited her this afternoon just after 3 PM and noticed that she had slurred speech and felt that her right arm was weaker than the left. EMS was called who also noted some right-sided weakness and difficulty with her speech and activated code stroke. Patient's history is limited due to her confusion/aphasia. She reportedly was seen either yesterday or the day before by a neighbor and was normal at that point. The neighbor who is with her at bedside said that she has had some occasional confusion but not to this extent.        Past Medical History:  Diagnosis Date  . Arthritis   . Asthma   . COPD (chronic obstructive pulmonary disease) [facility]     Patient Active Problem List   Diagnosis Date Noted  . CVA (cerebral vascular accident) (HCC) 07/09/2020  . Acute asthma exacerbation 05/21/2019  . Adjustment disorder with anxious mood 02/25/2018  . Essential hypertension 02/25/2018  . GERD (gastroesophageal reflux disease) 02/25/2018    Past Surgical History:  Procedure Laterality Date  . TONSILLECTOMY       OB History   No obstetric history on file.     Family History  Problem Relation Age of Onset  . Heart disease Mother   . Stroke Father     Social History   Tobacco Use  . Smoking status: Former Games developer  . Smokeless tobacco: Never Used  Substance Use Topics  . Alcohol use: Yes    . Drug use: Never    Home Medications Prior to Admission medications   Medication Sig Start Date End Date Taking? Authorizing Provider  albuterol (VENTOLIN HFA) 108 (90 Base) MCG/ACT inhaler Inhale 2 puffs into the lungs every 6 (six) hours as needed. 06/09/20   [provider]  diltiazem (CARDIZEM CD) 240 MG 24 hr capsule Take 240 mg by mouth daily. 04/26/20   [provider]  donepezil (ARICEPT) 10 MG tablet Take 10 mg by mouth daily. 04/26/20   [provider]  escitalopram (LEXAPRO) 20 MG tablet Take 20 mg by mouth daily. 04/26/20   [provider]  fluticasone (FLONASE) 50 MCG/ACT nasal spray Place 2 sprays into both nostrils daily. 04/26/20   [provider]  levothyroxine (SYNTHROID) 25 MCG tablet Take 25 mcg by mouth every morning. 01/16/20   [provider]  meclizine (ANTIVERT) 25 MG tablet Take 1 tablet (25 mg total) by mouth 3 (three) times daily as needed for dizziness. 05/22/19   Dartha Lodge, PA-C  montelukast (SINGULAIR) 10 MG tablet Take 10 mg by mouth daily. 01/16/20   [provider]  pantoprazole (PROTONIX) 20 MG tablet Take 20 mg by mouth daily. 01/17/20   [provider]  SYMBICORT 160-4.5 MCG/ACT inhaler Inhale 2 puffs into the lungs 2 (two) times daily. 03/05/20   [provider]    Allergies    Patient has no  known allergies.  Review of Systems   Review of Systems  Unable to perform ROS: Mental status change    Physical Exam Updated Vital Signs BP (!) 180/82   Pulse (!) 58   Temp 98.1 F (36.7 C) (Oral)   Resp 20   SpO2 97%   Physical Exam Constitutional:      Appearance: She is well-developed.  HENT:     Head: Normocephalic and atraumatic.  Eyes:     Pupils: Pupils are equal, round, and reactive to light.  Cardiovascular:     Rate and Rhythm: Normal rate and regular rhythm.     Heart sounds: Normal heart sounds.  Pulmonary:     Effort: Pulmonary effort is normal. No  respiratory distress.     Breath sounds: Normal breath sounds. No wheezing or rales.  Chest:     Chest wall: No tenderness.  Abdominal:     General: Bowel sounds are normal.     Palpations: Abdomen is soft.     Tenderness: There is no abdominal tenderness. There is no guarding or rebound.  Musculoskeletal:        General: Normal range of motion.     Cervical back: Normal range of motion and neck supple.  Lymphadenopathy:     Cervical: No cervical adenopathy.  Skin:    General: Skin is warm and dry.     Findings: No rash.  Neurological:     Mental Status: She is alert.     Comments: Patient can tell me her birthdate and her name but is not able to tell me place or time. She seems to have a component of confusion but also seems to have some expressive aphasia. When she tries to name something, she has difficulty with naming. She has normal strength in all extremities. No definite sensory deficit. No gaze deviation or facial drooping.     ED Results / Procedures / Treatments   Labs (all labs ordered are listed, but only abnormal results are displayed) Labs Reviewed  CBC - Abnormal; Notable for the following components:      Result Value   RBC 3.60 (*)    Hemoglobin 11.9 (*)    MCV 101.9 (*)    All other components within normal limits  DIFFERENTIAL - Abnormal; Notable for the following components:   Eosinophils Absolute 1.0 (*)    All other components within normal limits  COMPREHENSIVE METABOLIC PANEL - Abnormal; Notable for the following components:   Glucose, Bld 103 (*)    All other components within normal limits  POCT I-STAT, CHEM 8 - Abnormal; Notable for the following components:   Glucose, Bld 101 (*)    All other components within normal limits  SARS CORONAVIRUS 2 BY RT PCR (HOSPITAL ORDER, PERFORMED IN [facility])  PROTIME-INR  APTT  URINALYSIS, ROUTINE W REFLEX MICROSCOPIC  HEMOGLOBIN A1C  TSH  LIPID PANEL  MAGNESIUM  BASIC METABOLIC PANEL  CBC   URINALYSIS, ROUTINE W REFLEX MICROSCOPIC  I-STAT CHEM 8, ED  CBG MONITORING, ED    EKG None  Radiology CT Code Stroke CTA Head W/WO contrast  Result Date: 07/09/2020 CLINICAL DATA:  Code stroke.  Right-sided weakness EXAM: CT HEAD CODE STROKE WITHOUT CT ANGIOGRAPHY HEAD AND NECK CT PERFUSION BRAIN TECHNIQUE: Multidetector CT imaging of the head and neck was performed using the standard protocol during bolus administration of intravenous contrast. Multiplanar CT image reconstructions and MIPs were obtained to evaluate the vascular anatomy. Carotid stenosis measurements (when  applicable) are obtained utilizing NASCET criteria, using the distal internal carotid diameter as the denominator. Multiphase CT imaging of the brain was performed following IV bolus contrast injection. Subsequent parametric perfusion maps were calculated using RAPID software. CONTRAST:  100 mL Omnipaque 350 COMPARISON:  None. FINDINGS: CT HEAD FINDINGS Brain: There is no acute intracranial hemorrhage, mass effect, or edema. Gray-white differentiation is preserved. Ventricles and sulci are prominent reflecting generalized parenchymal volume loss. Patchy hypoattenuation in the supratentorial white matter is nonspecific but probably reflects chronic microvascular ischemic changes. Vascular: There is no hyperdense vessel. Skull: Unremarkable. Sinuses/Orbits: No acute abnormality. Other: None. ASPECTS (Alberta Stroke Program Early CT Score) - Ganglionic level infarction (caudate, lentiform nuclei, internal capsule, insula, M1-M3 cortex): 7 - Supraganglionic infarction (M4-M6 cortex): 3 Total score (0-10 with 10 being normal): 10 Review of the MIP images confirms the above findings CTA NECK FINDINGS Aortic arch: Calcified plaque is present along the arch and at the patent great vessel origins. Right carotid system: Patent. There is calcified plaque at the ICA origin causing less than 50% stenosis. Left carotid system: Patent. There is  calcified plaque at the ICA origin causing less than 50% stenosis. Vertebral arteries: Patent.  Left vertebral artery is dominant. Skeleton: Multilevel degenerative changes of the cervical spine. Other neck: No mass or adenopathy. Upper chest: No apical lung mass. Review of the MIP images confirms the above findings CTA HEAD FINDINGS Anterior circulation: Intracranial internal carotid arteries are patent with calcified plaque causing mild stenosis. Right middle and both anterior cerebral arteries are patent. Left M1 MCA is patent. Proximal left M2 branches are patent. There is diminished opacification in a vessel adjacent to a left M2 branch within the sylvian fissure, which is favored to be venous particularly given perfusion findings. Posterior circulation: Intracranial vertebral arteries are patent with mild calcified plaque on the right. PICA origins are patent. Basilar artery is patent. Superior cerebellar origins are patent. Posterior cerebral arteries are patent. Venous sinuses: Not well evaluated Review of the MIP images confirms the above findings CT Brain Perfusion Findings: CBF (<30%) Volume: 0mL Perfusion (Tmax>6.0s) volume: 0mL Mismatch Volume: 0mL Infarction Location:None IMPRESSION: No acute intracranial hemorrhage or evidence of acute infarction. ASPECT score is 10. Perfusion imaging demonstrates no evidence of core infarction or penumbra. No large vessel occlusion. Diminished opacification of a vessel visually contiguous with a left M2 MCA within the sylvian fissure is favored to be venous particularly given perfusion findings Calcified plaque at the ICA origins causing less than 50% stenosis. These results were communicated to Dr. Pearlean Brownie at 5:00 pmon 8/2/2021by text page via the [facility] messaging system. Electronically Signed   By: Guadlupe Spanish M.D.   On: 07/09/2020 17:05   DG Chest 1 View  Result Date: 07/09/2020 CLINICAL DATA:  MRI screening EXAM: CHEST  1 VIEW COMPARISON:  Radiograph  05/21/2019, CT 05/03/2019 FINDINGS: Consolidative opacity is seen in the periphery of the right lower lobe centered upon several thickened airways. No pneumothorax or visible effusion. The aorta is calcified. The remaining cardiomediastinal contours are unremarkable. No acute osseous or soft tissue abnormality. Degenerative changes are present in the imaged spine and shoulders. Rounded button leads and telemetry wires overlie the chest as well as a metallic fastener projecting over the right shoulder likely related to garment. No other radiopaque foreign bodies. IMPRESSION: Consolidative opacity in the periphery of the right lower lobe centered upon several thickened airways, suspicious for pneumonia or aspiration. Telemetry button leads and wires projecting over the chest. Metallic  fastener over the right shoulder related to garment. Electronically Signed   By: Kreg Shropshire M.D.   On: 07/09/2020 18:34   DG Abdomen 1 View  Result Date: 07/09/2020 CLINICAL DATA:  Screening for MRI. EXAM: ABDOMEN - 1 VIEW COMPARISON:  None. FINDINGS: There is no definite concerning metallic foreign body identified on this study. Excreted contrast is noted within the collecting system and urinary bladder. The bowel gas pattern is nonobstructive. There are degenerative changes of the hips and spine. IMPRESSION: No definite metallic foreign body identified on this study. Excreted contrast is noted in the collecting system and urinary bladder. Electronically Signed   By: Katherine Mantle M.D.   On: 07/09/2020 18:34   CT Code Stroke CTA Neck W/WO contrast  Result Date: 07/09/2020 CLINICAL DATA:  Code stroke.  Right-sided weakness EXAM: CT HEAD CODE STROKE WITHOUT CT ANGIOGRAPHY HEAD AND NECK CT PERFUSION BRAIN TECHNIQUE: Multidetector CT imaging of the head and neck was performed using the standard protocol during bolus administration of intravenous contrast. Multiplanar CT image reconstructions and MIPs were obtained to evaluate  the vascular anatomy. Carotid stenosis measurements (when applicable) are obtained utilizing NASCET criteria, using the distal internal carotid diameter as the denominator. Multiphase CT imaging of the brain was performed following IV bolus contrast injection. Subsequent parametric perfusion maps were calculated using RAPID software. CONTRAST:  100 mL Omnipaque 350 COMPARISON:  None. FINDINGS: CT HEAD FINDINGS Brain: There is no acute intracranial hemorrhage, mass effect, or edema. Gray-white differentiation is preserved. Ventricles and sulci are prominent reflecting generalized parenchymal volume loss. Patchy hypoattenuation in the supratentorial white matter is nonspecific but probably reflects chronic microvascular ischemic changes. Vascular: There is no hyperdense vessel. Skull: Unremarkable. Sinuses/Orbits: No acute abnormality. Other: None. ASPECTS (Alberta Stroke Program Early CT Score) - Ganglionic level infarction (caudate, lentiform nuclei, internal capsule, insula, M1-M3 cortex): 7 - Supraganglionic infarction (M4-M6 cortex): 3 Total score (0-10 with 10 being normal): 10 Review of the MIP images confirms the above findings CTA NECK FINDINGS Aortic arch: Calcified plaque is present along the arch and at the patent great vessel origins. Right carotid system: Patent. There is calcified plaque at the ICA origin causing less than 50% stenosis. Left carotid system: Patent. There is calcified plaque at the ICA origin causing less than 50% stenosis. Vertebral arteries: Patent.  Left vertebral artery is dominant. Skeleton: Multilevel degenerative changes of the cervical spine. Other neck: No mass or adenopathy. Upper chest: No apical lung mass. Review of the MIP images confirms the above findings CTA HEAD FINDINGS Anterior circulation: Intracranial internal carotid arteries are patent with calcified plaque causing mild stenosis. Right middle and both anterior cerebral arteries are patent. Left M1 MCA is patent.  Proximal left M2 branches are patent. There is diminished opacification in a vessel adjacent to a left M2 branch within the sylvian fissure, which is favored to be venous particularly given perfusion findings. Posterior circulation: Intracranial vertebral arteries are patent with mild calcified plaque on the right. PICA origins are patent. Basilar artery is patent. Superior cerebellar origins are patent. Posterior cerebral arteries are patent. Venous sinuses: Not well evaluated Review of the MIP images confirms the above findings CT Brain Perfusion Findings: CBF (<30%) Volume: 32mL Perfusion (Tmax>6.0s) volume: 42mL Mismatch Volume: 1mL Infarction Location:None IMPRESSION: No acute intracranial hemorrhage or evidence of acute infarction. ASPECT score is 10. Perfusion imaging demonstrates no evidence of core infarction or penumbra. No large vessel occlusion. Diminished opacification of a vessel visually contiguous with a left M2  MCA within the sylvian fissure is favored to be venous particularly given perfusion findings Calcified plaque at the ICA origins causing less than 50% stenosis. These results were communicated to Dr. Pearlean Brownie at 5:00 pmon 8/2/2021by text page via the [facility] messaging system. Electronically Signed   By: Guadlupe Spanish M.D.   On: 07/09/2020 17:05   CT Code Stroke Cerebral Perfusion with contrast  Result Date: 07/09/2020 CLINICAL DATA:  Code stroke.  Right-sided weakness EXAM: CT HEAD CODE STROKE WITHOUT CT ANGIOGRAPHY HEAD AND NECK CT PERFUSION BRAIN TECHNIQUE: Multidetector CT imaging of the head and neck was performed using the standard protocol during bolus administration of intravenous contrast. Multiplanar CT image reconstructions and MIPs were obtained to evaluate the vascular anatomy. Carotid stenosis measurements (when applicable) are obtained utilizing NASCET criteria, using the distal internal carotid diameter as the denominator. Multiphase CT imaging of the brain was performed following  IV bolus contrast injection. Subsequent parametric perfusion maps were calculated using RAPID software. CONTRAST:  100 mL Omnipaque 350 COMPARISON:  None. FINDINGS: CT HEAD FINDINGS Brain: There is no acute intracranial hemorrhage, mass effect, or edema. Gray-white differentiation is preserved. Ventricles and sulci are prominent reflecting generalized parenchymal volume loss. Patchy hypoattenuation in the supratentorial white matter is nonspecific but probably reflects chronic microvascular ischemic changes. Vascular: There is no hyperdense vessel. Skull: Unremarkable. Sinuses/Orbits: No acute abnormality. Other: None. ASPECTS (Alberta Stroke Program Early CT Score) - Ganglionic level infarction (caudate, lentiform nuclei, internal capsule, insula, M1-M3 cortex): 7 - Supraganglionic infarction (M4-M6 cortex): 3 Total score (0-10 with 10 being normal): 10 Review of the MIP images confirms the above findings CTA NECK FINDINGS Aortic arch: Calcified plaque is present along the arch and at the patent great vessel origins. Right carotid system: Patent. There is calcified plaque at the ICA origin causing less than 50% stenosis. Left carotid system: Patent. There is calcified plaque at the ICA origin causing less than 50% stenosis. Vertebral arteries: Patent.  Left vertebral artery is dominant. Skeleton: Multilevel degenerative changes of the cervical spine. Other neck: No mass or adenopathy. Upper chest: No apical lung mass. Review of the MIP images confirms the above findings CTA HEAD FINDINGS Anterior circulation: Intracranial internal carotid arteries are patent with calcified plaque causing mild stenosis. Right middle and both anterior cerebral arteries are patent. Left M1 MCA is patent. Proximal left M2 branches are patent. There is diminished opacification in a vessel adjacent to a left M2 branch within the sylvian fissure, which is favored to be venous particularly given perfusion findings. Posterior circulation:  Intracranial vertebral arteries are patent with mild calcified plaque on the right. PICA origins are patent. Basilar artery is patent. Superior cerebellar origins are patent. Posterior cerebral arteries are patent. Venous sinuses: Not well evaluated Review of the MIP images confirms the above findings CT Brain Perfusion Findings: CBF (<30%) Volume: 0mL Perfusion (Tmax>6.0s) volume: 0mL Mismatch Volume: 0mL Infarction Location:None IMPRESSION: No acute intracranial hemorrhage or evidence of acute infarction. ASPECT score is 10. Perfusion imaging demonstrates no evidence of core infarction or penumbra. No large vessel occlusion. Diminished opacification of a vessel visually contiguous with a left M2 MCA within the sylvian fissure is favored to be venous particularly given perfusion findings Calcified plaque at the ICA origins causing less than 50% stenosis. These results were communicated to Dr. Pearlean Brownie at 5:00 pmon 8/2/2021by text page via the [facility] messaging system. Electronically Signed   By: Guadlupe Spanish M.D.   On: 07/09/2020 17:05   CT HEAD CODE STROKE  WO CONTRAST  Result Date: 07/09/2020 CLINICAL DATA:  Code stroke.  Right-sided weakness EXAM: CT HEAD CODE STROKE WITHOUT CT ANGIOGRAPHY HEAD AND NECK CT PERFUSION BRAIN TECHNIQUE: Multidetector CT imaging of the head and neck was performed using the standard protocol during bolus administration of intravenous contrast. Multiplanar CT image reconstructions and MIPs were obtained to evaluate the vascular anatomy. Carotid stenosis measurements (when applicable) are obtained utilizing NASCET criteria, using the distal internal carotid diameter as the denominator. Multiphase CT imaging of the brain was performed following IV bolus contrast injection. Subsequent parametric perfusion maps were calculated using RAPID software. CONTRAST:  100 mL Omnipaque 350 COMPARISON:  None. FINDINGS: CT HEAD FINDINGS Brain: There is no acute intracranial hemorrhage, mass effect, or  edema. Gray-white differentiation is preserved. Ventricles and sulci are prominent reflecting generalized parenchymal volume loss. Patchy hypoattenuation in the supratentorial white matter is nonspecific but probably reflects chronic microvascular ischemic changes. Vascular: There is no hyperdense vessel. Skull: Unremarkable. Sinuses/Orbits: No acute abnormality. Other: None. ASPECTS (Alberta Stroke Program Early CT Score) - Ganglionic level infarction (caudate, lentiform nuclei, internal capsule, insula, M1-M3 cortex): 7 - Supraganglionic infarction (M4-M6 cortex): 3 Total score (0-10 with 10 being normal): 10 Review of the MIP images confirms the above findings CTA NECK FINDINGS Aortic arch: Calcified plaque is present along the arch and at the patent great vessel origins. Right carotid system: Patent. There is calcified plaque at the ICA origin causing less than 50% stenosis. Left carotid system: Patent. There is calcified plaque at the ICA origin causing less than 50% stenosis. Vertebral arteries: Patent.  Left vertebral artery is dominant. Skeleton: Multilevel degenerative changes of the cervical spine. Other neck: No mass or adenopathy. Upper chest: No apical lung mass. Review of the MIP images confirms the above findings CTA HEAD FINDINGS Anterior circulation: Intracranial internal carotid arteries are patent with calcified plaque causing mild stenosis. Right middle and both anterior cerebral arteries are patent. Left M1 MCA is patent. Proximal left M2 branches are patent. There is diminished opacification in a vessel adjacent to a left M2 branch within the sylvian fissure, which is favored to be venous particularly given perfusion findings. Posterior circulation: Intracranial vertebral arteries are patent with mild calcified plaque on the right. PICA origins are patent. Basilar artery is patent. Superior cerebellar origins are patent. Posterior cerebral arteries are patent. Venous sinuses: Not well evaluated  Review of the MIP images confirms the above findings CT Brain Perfusion Findings: CBF (<30%) Volume: 0mL Perfusion (Tmax>6.0s) volume: 0mL Mismatch Volume: 0mL Infarction Location:None IMPRESSION: No acute intracranial hemorrhage or evidence of acute infarction. ASPECT score is 10. Perfusion imaging demonstrates no evidence of core infarction or penumbra. No large vessel occlusion. Diminished opacification of a vessel visually contiguous with a left M2 MCA within the sylvian fissure is favored to be venous particularly given perfusion findings Calcified plaque at the ICA origins causing less than 50% stenosis. These results were communicated to Dr. Pearlean Brownie at 5:00 pmon 8/2/2021by text page via the [facility] messaging system. Electronically Signed   By: Guadlupe Spanish M.D.   On: 07/09/2020 17:05    Procedures Procedures (including critical care time)  Medications Ordered in ED Medications  sodium chloride flush (NS) 0.9 % injection 3 mL (has no administration in time range)  albuterol (PROVENTIL) (2.5 MG/3ML) 0.083% nebulizer solution 3 mL (has no administration in time range)  meclizine (ANTIVERT) tablet 25 mg (has no administration in time range)  levothyroxine (SYNTHROID) tablet 25 mcg (has no administration in time  range)  diltiazem (CARDIZEM CD) 24 hr capsule 240 mg (has no administration in time range)  mometasone-formoterol (DULERA) 200-5 MCG/ACT inhaler 2 puff (has no administration in time range)  pantoprazole (PROTONIX) EC tablet 20 mg (has no administration in time range)  enoxaparin (LOVENOX) injection 40 mg (has no administration in time range)  acetaminophen (TYLENOL) tablet 650 mg (has no administration in time range)    Or  acetaminophen (TYLENOL) suppository 650 mg (has no administration in time range)  senna-docusate (Senokot-S) tablet 1 tablet (has no administration in time range)  ondansetron (ZOFRAN) tablet 4 mg (has no administration in time range)    Or  ondansetron (ZOFRAN)  injection 4 mg (has no administration in time range)  iohexol (OMNIPAQUE) 350 MG/ML injection 100 mL (100 mLs Intravenous Contrast Given 07/09/20 1700)    ED Course  I have reviewed the triage vital signs and the nursing notes.  Pertinent labs & imaging results that were available during my care of the patient were reviewed by me and considered in my medical decision making (see chart for details).    MDM Rules/Calculators/A&P                          Patient is a 86 year old female who presents as a code stroke.  She reportedly had some right side weakness and confusion prior to arrival.  Here she does seem to have some confusion and expressive aphasia.  I do not appreciate any extremity weakness.  Neurology has seen the patient.  Her initial imaging studies do not reveal any acute intracranial hemorrhage or evidence of acute stroke.  MRI has been ordered and patient will need admission for further evaluation.  Her labs are reviewed by me and are nonconcerning.  I discussed pt with the IM resident who will admit the pt for unassigned. Final Clinical Impression(s) / ED Diagnoses Final diagnoses:  Aphasia  Confusion    Rx / DC Orders ED Discharge Orders    None       Rolan Bucco, MD 07/09/20 2426    Rolan Bucco, MD 07/09/20 2112

## 2020-07-09 NOTE — Code Documentation (Signed)
Stroke Response Nurse Documentation Code Documentation  Theresa Landry is a 86 y.o. female arriving to [facility] ED via [facility] on 8/2 with past medical hx of HTN. Code stroke was activated by EMS. Patient from home where she lives by herslef where she was LKW at 2000 yesterday evening and now complaining of confusion and aphasia. Stroke team at the bedside on patient arrival. Labs drawn and patient cleared for CT by EDP. Patient to CT with team. NIHSS 7, see documentation for details and code stroke times. Patient with disoriented, right hemianopia, Expressive aphasia , dysarthria  and Visual  neglect on exam. The following imaging was completed:  CT, CTA head and neck, CTP. Patient is not a candidate for tPA due to being outside window. Pt to be admitted. EEG and MRI to be ordered. Bedside handoff with ED RN Tammy Sours.    Lucila Maine  Stroke Response RN

## 2020-07-09 NOTE — Consult Note (Signed)
Reason for Consult: code stroke Referring Physician: Dr Fredderick Phenix    HPI:  Theresa Landry is a 86 y.o. female arriving to [facility] ED via [facility] on 07/09/20 with past medical hx of HTN. Code stroke was activated by EMS. Patient from home where she lives by herslef where she was LKW at 2000 yesterday evening and noted by neighbour to have  confusion and aphasia at 3 pm today. EMS noted RUE drift prior to arrival to ER and positive LVO scale of 3.. Stroke team at the bedside on patient arrival. Labs drawn and patient cleared for CT by EDP. Patient to CT with team. NIHSS 7, . Patient with disoriented, right hemianopia, mild aphasia , dysarthria  and Visual  neglect on exam.   Noncontrast CT scan of the head was obtained which showed aspect score of 10 without any acute abnormality and only mild age-appropriate changes of chronic small vessel disease and atrophy.  CT angiogram of the brain was obtained emergently which showed no LVO and CT perfusion was negative.  Patient's sister arrived and gave history that she is independent at baseline and was found to be confused today by the neighbor.  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  She does not take any blood thinners. Past Medical History:  Diagnosis Date   Arthritis    Asthma    COPD (chronic obstructive pulmonary disease) (HCC)     Past Surgical History:  Procedure Laterality Date   TONSILLECTOMY      Family History  Problem Relation Age of Onset   Heart disease Mother    Stroke Father     Social History:  reports that she has quit smoking. She has never used smokeless tobacco. She reports current alcohol use. She reports that she does not use drugs.  Allergies: No Known Allergies  Medications: I have reviewed the patient's current medications.  Results for orders placed or performed during the hospital  encounter of 07/09/20 (from the past 48 hour(s))  CBG monitoring, ED     Status: None   Collection Time: 07/09/20  4:34 PM  Result Value Ref Range   Glucose-Capillary 89 70 - 99 mg/dL    Comment: Glucose reference range applies only to samples taken after fasting for at least 8 hours.  Protime-INR     Status: None   Collection Time: 07/09/20  4:36 PM  Result Value Ref Range   Prothrombin Time 13.4 11.4 - 15.2 seconds   INR 1.1 0.8 - 1.2    Comment: (NOTE) INR goal varies based on device and disease states. Performed at [facility], [address] [place], [place] [address]   APTT     Status: None   Collection Time: 07/09/20  4:36 PM  Result Value Ref Range   aPTT 27 24 - 36 seconds    Comment: Performed at [facility], [address] [place], [place] [address]  CBC     Status: Abnormal   Collection Time: 07/09/20  4:36 PM  Result Value Ref Range   WBC 10.2 4.0 - 10.5 K/uL   RBC 3.60 (L) 3.87 - 5.11 MIL/uL   Hemoglobin 11.9 (L) 12.0 - 15.0 g/dL   HCT 70.3 36 - 46 %   MCV 101.9 (H) 80.0 - 100.0 fL   MCH 33.1 26.0 - 34.0 pg  MCHC 32.4 30.0 - 36.0 g/dL   RDW 48.1 85.9 - 09.3 %   Platelets 225 150 - 400 K/uL   nRBC 0.0 0.0 - 0.2 %    Comment: Performed at [facility], [address], [place], [place] [address]  Differential     Status: Abnormal   Collection Time: 07/09/20  4:36 PM  Result Value Ref Range   Neutrophils Relative % 44 %   Neutro Abs 4.4 1.7 - 7.7 K/uL   Lymphocytes Relative 38 %   Lymphs Abs 3.9 0.7 - 4.0 K/uL   Monocytes Relative 7 %   Monocytes Absolute 0.7 0 - 1 K/uL   Eosinophils Relative 10 %   Eosinophils Absolute 1.0 (H) 0 - 0 K/uL   Basophils Relative 1 %   Basophils Absolute 0.1 0 - 0 K/uL   Immature Granulocytes 0 %   Abs Immature Granulocytes 0.02 0.00 - 0.07 K/uL    Comment: Performed at [facility], [address] [place], [place] [address]  I-STAT, [place] [address]     Status: Abnormal   Collection Time: 07/09/20  4:41  PM  Result Value Ref Range   Sodium 139 135 - 145 mmol/L   Potassium 4.0 3.5 - 5.1 mmol/L   Chloride 102 98 - 111 mmol/L   BUN 11 8 - 23 mg/dL   Creatinine, Ser 5.07 0.44 - 1.00 mg/dL   Glucose, Bld 225 (H) 70 - 99 mg/dL    Comment: Glucose reference range applies only to samples taken after fasting for at least 8 hours.   Calcium, Ion 1.15 1.15 - 1.40 mmol/L   TCO2 23 22 - 32 mmol/L   Hemoglobin 12.9 12.0 - 15.0 g/dL   HCT 75.0 36 - 46 %    CT Code Stroke CTA Head W/WO contrast  Result Date: 07/09/2020 CLINICAL DATA:  Code stroke.  Right-sided weakness EXAM: CT HEAD CODE STROKE WITHOUT CT ANGIOGRAPHY HEAD AND NECK CT PERFUSION BRAIN TECHNIQUE: Multidetector CT imaging of the head and neck was performed using the standard protocol during bolus administration of intravenous contrast. Multiplanar CT image reconstructions and MIPs were obtained to evaluate the vascular anatomy. Carotid stenosis measurements (when applicable) are obtained utilizing NASCET criteria, using the distal internal carotid diameter as the denominator. Multiphase CT imaging of the brain was performed following IV bolus contrast injection. Subsequent parametric perfusion maps were calculated using RAPID software. CONTRAST:  100 mL Omnipaque 350 COMPARISON:  None. FINDINGS: CT HEAD FINDINGS Brain: There is no acute intracranial hemorrhage, mass effect, or edema. Gray-white differentiation is preserved. Ventricles and sulci are prominent reflecting generalized parenchymal volume loss. Patchy hypoattenuation in the supratentorial white matter is nonspecific but probably reflects chronic microvascular ischemic changes. Vascular: There is no hyperdense vessel. Skull: Unremarkable. Sinuses/Orbits: No acute abnormality. Other: None. ASPECTS (Alberta Stroke Program Early CT Score) - Ganglionic level infarction (caudate, lentiform nuclei, internal capsule, insula, M1-M3 cortex): 7 - Supraganglionic infarction (M4-M6 cortex): 3 Total score  (0-10 with 10 being normal): 10 Review of the MIP images confirms the above findings CTA NECK FINDINGS Aortic arch: Calcified plaque is present along the arch and at the patent great vessel origins. Right carotid system: Patent. There is calcified plaque at the ICA origin causing less than 50% stenosis. Left carotid system: Patent. There is calcified plaque at the ICA origin causing less than 50% stenosis. Vertebral arteries: Patent.  Left vertebral artery is dominant. Skeleton: Multilevel degenerative changes of the cervical spine. Other neck:  No mass or adenopathy. Upper chest: No apical lung mass. Review of the MIP images confirms the above findings CTA HEAD FINDINGS Anterior circulation: Intracranial internal carotid arteries are patent with calcified plaque causing mild stenosis. Right middle and both anterior cerebral arteries are patent. Left M1 MCA is patent. Proximal left M2 branches are patent. There is diminished opacification in a vessel adjacent to a left M2 branch within the sylvian fissure, which is favored to be venous particularly given perfusion findings. Posterior circulation: Intracranial vertebral arteries are patent with mild calcified plaque on the right. PICA origins are patent. Basilar artery is patent. Superior cerebellar origins are patent. Posterior cerebral arteries are patent. Venous sinuses: Not well evaluated Review of the MIP images confirms the above findings CT Brain Perfusion Findings: CBF (<30%) Volume: 59mL Perfusion (Tmax>6.0s) volume: 78mL Mismatch Volume: 28mL Infarction Location:None IMPRESSION: No acute intracranial hemorrhage or evidence of acute infarction. ASPECT score is 10. Perfusion imaging demonstrates no evidence of core infarction or penumbra. No large vessel occlusion. Diminished opacification of a vessel visually contiguous with a left M2 MCA within the sylvian fissure is favored to be venous particularly given perfusion findings Calcified plaque at the ICA origins  causing less than 50% stenosis. These results were communicated to Dr. Pearlean Brownie at 5:00 pmon 8/2/2021by text page via the [facility] messaging system. Electronically Signed   By: Guadlupe Spanish M.D.   On: 07/09/2020 17:05   CT Code Stroke CTA Neck W/WO contrast  Result Date: 07/09/2020 CLINICAL DATA:  Code stroke.  Right-sided weakness EXAM: CT HEAD CODE STROKE WITHOUT CT ANGIOGRAPHY HEAD AND NECK CT PERFUSION BRAIN TECHNIQUE: Multidetector CT imaging of the head and neck was performed using the standard protocol during bolus administration of intravenous contrast. Multiplanar CT image reconstructions and MIPs were obtained to evaluate the vascular anatomy. Carotid stenosis measurements (when applicable) are obtained utilizing NASCET criteria, using the distal internal carotid diameter as the denominator. Multiphase CT imaging of the brain was performed following IV bolus contrast injection. Subsequent parametric perfusion maps were calculated using RAPID software. CONTRAST:  100 mL Omnipaque 350 COMPARISON:  None. FINDINGS: CT HEAD FINDINGS Brain: There is no acute intracranial hemorrhage, mass effect, or edema. Gray-white differentiation is preserved. Ventricles and sulci are prominent reflecting generalized parenchymal volume loss. Patchy hypoattenuation in the supratentorial white matter is nonspecific but probably reflects chronic microvascular ischemic changes. Vascular: There is no hyperdense vessel. Skull: Unremarkable. Sinuses/Orbits: No acute abnormality. Other: None. ASPECTS (Alberta Stroke Program Early CT Score) - Ganglionic level infarction (caudate, lentiform nuclei, internal capsule, insula, M1-M3 cortex): 7 - Supraganglionic infarction (M4-M6 cortex): 3 Total score (0-10 with 10 being normal): 10 Review of the MIP images confirms the above findings CTA NECK FINDINGS Aortic arch: Calcified plaque is present along the arch and at the patent great vessel origins. Right carotid system: Patent. There is  calcified plaque at the ICA origin causing less than 50% stenosis. Left carotid system: Patent. There is calcified plaque at the ICA origin causing less than 50% stenosis. Vertebral arteries: Patent.  Left vertebral artery is dominant. Skeleton: Multilevel degenerative changes of the cervical spine. Other neck: No mass or adenopathy. Upper chest: No apical lung mass. Review of the MIP images confirms the above findings CTA HEAD FINDINGS Anterior circulation: Intracranial internal carotid arteries are patent with calcified plaque causing mild stenosis. Right middle and both anterior cerebral arteries are patent. Left M1 MCA is patent. Proximal left M2 branches are patent. There is diminished opacification in a vessel  adjacent to a left M2 branch within the sylvian fissure, which is favored to be venous particularly given perfusion findings. Posterior circulation: Intracranial vertebral arteries are patent with mild calcified plaque on the right. PICA origins are patent. Basilar artery is patent. Superior cerebellar origins are patent. Posterior cerebral arteries are patent. Venous sinuses: Not well evaluated Review of the MIP images confirms the above findings CT Brain Perfusion Findings: CBF (<30%) Volume: 23mL Perfusion (Tmax>6.0s) volume: 19mL Mismatch Volume: 84mL Infarction Location:None IMPRESSION: No acute intracranial hemorrhage or evidence of acute infarction. ASPECT score is 10. Perfusion imaging demonstrates no evidence of core infarction or penumbra. No large vessel occlusion. Diminished opacification of a vessel visually contiguous with a left M2 MCA within the sylvian fissure is favored to be venous particularly given perfusion findings Calcified plaque at the ICA origins causing less than 50% stenosis. These results were communicated to Dr. Pearlean Brownie at 5:00 pmon 8/2/2021by text page via the [facility] messaging system. Electronically Signed   By: Guadlupe Spanish M.D.   On: 07/09/2020 17:05   CT Code Stroke  Cerebral Perfusion with contrast  Result Date: 07/09/2020 CLINICAL DATA:  Code stroke.  Right-sided weakness EXAM: CT HEAD CODE STROKE WITHOUT CT ANGIOGRAPHY HEAD AND NECK CT PERFUSION BRAIN TECHNIQUE: Multidetector CT imaging of the head and neck was performed using the standard protocol during bolus administration of intravenous contrast. Multiplanar CT image reconstructions and MIPs were obtained to evaluate the vascular anatomy. Carotid stenosis measurements (when applicable) are obtained utilizing NASCET criteria, using the distal internal carotid diameter as the denominator. Multiphase CT imaging of the brain was performed following IV bolus contrast injection. Subsequent parametric perfusion maps were calculated using RAPID software. CONTRAST:  100 mL Omnipaque 350 COMPARISON:  None. FINDINGS: CT HEAD FINDINGS Brain: There is no acute intracranial hemorrhage, mass effect, or edema. Gray-white differentiation is preserved. Ventricles and sulci are prominent reflecting generalized parenchymal volume loss. Patchy hypoattenuation in the supratentorial white matter is nonspecific but probably reflects chronic microvascular ischemic changes. Vascular: There is no hyperdense vessel. Skull: Unremarkable. Sinuses/Orbits: No acute abnormality. Other: None. ASPECTS (Alberta Stroke Program Early CT Score) - Ganglionic level infarction (caudate, lentiform nuclei, internal capsule, insula, M1-M3 cortex): 7 - Supraganglionic infarction (M4-M6 cortex): 3 Total score (0-10 with 10 being normal): 10 Review of the MIP images confirms the above findings CTA NECK FINDINGS Aortic arch: Calcified plaque is present along the arch and at the patent great vessel origins. Right carotid system: Patent. There is calcified plaque at the ICA origin causing less than 50% stenosis. Left carotid system: Patent. There is calcified plaque at the ICA origin causing less than 50% stenosis. Vertebral arteries: Patent.  Left vertebral artery is  dominant. Skeleton: Multilevel degenerative changes of the cervical spine. Other neck: No mass or adenopathy. Upper chest: No apical lung mass. Review of the MIP images confirms the above findings CTA HEAD FINDINGS Anterior circulation: Intracranial internal carotid arteries are patent with calcified plaque causing mild stenosis. Right middle and both anterior cerebral arteries are patent. Left M1 MCA is patent. Proximal left M2 branches are patent. There is diminished opacification in a vessel adjacent to a left M2 branch within the sylvian fissure, which is favored to be venous particularly given perfusion findings. Posterior circulation: Intracranial vertebral arteries are patent with mild calcified plaque on the right. PICA origins are patent. Basilar artery is patent. Superior cerebellar origins are patent. Posterior cerebral arteries are patent. Venous sinuses: Not well evaluated Review of the MIP images confirms  the above findings CT Brain Perfusion Findings: CBF (<30%) Volume: 0mL Perfusion (Tmax>6.0s) volume: 0mL Mismatch Volume: 0mL Infarction Location:None IMPRESSION: No acute intracranial hemorrhage or evidence of acute infarction. ASPECT score is 10. Perfusion imaging demonstrates no evidence of core infarction or penumbra. No large vessel occlusion. Diminished opacification of a vessel visually contiguous with a left M2 MCA within the sylvian fissure is favored to be venous particularly given perfusion findings Calcified plaque at the ICA origins causing less than 50% stenosis. These results were communicated to Dr. Pearlean Brownie at 5:00 pmon 8/2/2021by text page via the [facility] messaging system. Electronically Signed   By: Guadlupe Spanish M.D.   On: 07/09/2020 17:05   CT HEAD CODE STROKE WO CONTRAST  Result Date: 07/09/2020 CLINICAL DATA:  Code stroke.  Right-sided weakness EXAM: CT HEAD CODE STROKE WITHOUT CT ANGIOGRAPHY HEAD AND NECK CT PERFUSION BRAIN TECHNIQUE: Multidetector CT imaging of the head and  neck was performed using the standard protocol during bolus administration of intravenous contrast. Multiplanar CT image reconstructions and MIPs were obtained to evaluate the vascular anatomy. Carotid stenosis measurements (when applicable) are obtained utilizing NASCET criteria, using the distal internal carotid diameter as the denominator. Multiphase CT imaging of the brain was performed following IV bolus contrast injection. Subsequent parametric perfusion maps were calculated using RAPID software. CONTRAST:  100 mL Omnipaque 350 COMPARISON:  None. FINDINGS: CT HEAD FINDINGS Brain: There is no acute intracranial hemorrhage, mass effect, or edema. Gray-white differentiation is preserved. Ventricles and sulci are prominent reflecting generalized parenchymal volume loss. Patchy hypoattenuation in the supratentorial white matter is nonspecific but probably reflects chronic microvascular ischemic changes. Vascular: There is no hyperdense vessel. Skull: Unremarkable. Sinuses/Orbits: No acute abnormality. Other: None. ASPECTS (Alberta Stroke Program Early CT Score) - Ganglionic level infarction (caudate, lentiform nuclei, internal capsule, insula, M1-M3 cortex): 7 - Supraganglionic infarction (M4-M6 cortex): 3 Total score (0-10 with 10 being normal): 10 Review of the MIP images confirms the above findings CTA NECK FINDINGS Aortic arch: Calcified plaque is present along the arch and at the patent great vessel origins. Right carotid system: Patent. There is calcified plaque at the ICA origin causing less than 50% stenosis. Left carotid system: Patent. There is calcified plaque at the ICA origin causing less than 50% stenosis. Vertebral arteries: Patent.  Left vertebral artery is dominant. Skeleton: Multilevel degenerative changes of the cervical spine. Other neck: No mass or adenopathy. Upper chest: No apical lung mass. Review of the MIP images confirms the above findings CTA HEAD FINDINGS Anterior circulation:  Intracranial internal carotid arteries are patent with calcified plaque causing mild stenosis. Right middle and both anterior cerebral arteries are patent. Left M1 MCA is patent. Proximal left M2 branches are patent. There is diminished opacification in a vessel adjacent to a left M2 branch within the sylvian fissure, which is favored to be venous particularly given perfusion findings. Posterior circulation: Intracranial vertebral arteries are patent with mild calcified plaque on the right. PICA origins are patent. Basilar artery is patent. Superior cerebellar origins are patent. Posterior cerebral arteries are patent. Venous sinuses: Not well evaluated Review of the MIP images confirms the above findings CT Brain Perfusion Findings: CBF (<30%) Volume: 0mL Perfusion (Tmax>6.0s) volume: 0mL Mismatch Volume: 0mL Infarction Location:None IMPRESSION: No acute intracranial hemorrhage or evidence of acute infarction. ASPECT score is 10. Perfusion imaging demonstrates no evidence of core infarction or penumbra. No large vessel occlusion. Diminished opacification of a vessel visually contiguous with a left M2 MCA within the sylvian fissure  is favored to be venous particularly given perfusion findings Calcified plaque at the ICA origins causing less than 50% stenosis. These results were communicated to Dr. Pearlean Brownie at 5:00 pmon 8/2/2021by text page via the [facility] messaging system. Electronically Signed   By: Guadlupe Spanish M.D.   On: 07/09/2020 17:05      ROS as documented in above history of presenting illness and all other systems negative Blood pressure (!) 169/101, pulse 72, temperature 98.1 F (36.7 C), temperature source Oral, resp. rate 18, SpO2 96 %. Physical Exam Pleasant frail elderly Caucasian lady not in distress. . Afebrile. Head is nontraumatic. Neck is supple without bruit.    Cardiac exam no murmur or gallop. Lungs are clear to auscultation. Distal pulses are well felt. Neurological Exam : She is awake  alert oriented to person only.  Diminished attention, registration and recall.  Speech is nonfluent but able to answer her name.  Slow to follow commands.  Extraocular movements appear full range but she has some right gaze preference.  She has diminished vision acuity in the right eye and does not blink to threat on the right but does so on the left.  No facial weakness.  Tongue midline.  Motor system exam shows no upper or lower extremity drift symmetric and equal strength in all 4 extremities.  No focal weakness.  Sensation appears symmetric but she has slight neglect on the right.  Deep tendon reflexes are symmetric.  Plantars downgoing.  Gait not tested.  Assessment: 86 year old Caucasian lady presenting as a code stroke with an unclear last seen normal with sudden onset of confusion and some transient right-sided weakness and right-sided neglect possibly from small left hemispheric infarct.  CT scan is negative for bleed or large stroke and CT angiogram is negative for LVO.  Other possibilities include unwitnessed seizure with postictal confusion versus metabolic encephalopathy.  Plan: Admit to the medical service.  Check admission panel labs, UA, MRI scan of the brain.  Check EEG for seizures.  Further recommendations for stroke evaluation will follow if MRI confirms a stroke.  Long discussion with the patient as well as her sister at the bedside and with Dr. Fredderick Phenix and answered questions.  Greater than 50% time during this 80-minute consultation visit was spent on counseling and coordination of care about her aphasia, stroke and discussion of differential diagnosis and evaluation treatment plan and answering questions  Theresa Landry 07/09/2020, 5:16 PM    Note: This document was prepared with digital dictation and possible smart phrase technology. Any transcriptional errors that result from this process are unintentional.

## 2020-07-09 NOTE — H&P (Addendum)
Date: 07/09/2020               Theresa Landry Name:  Theresa Landry MRN: [id number]  DOB: 12-28-1932 Age / Sex: 86 y.o., female   PCP: Loyal Jacobson, MD         Medical Service: Internal Medicine Teaching Service         Attending Physician: Dr. Oswaldo Done, Marquita Palms, *    First Contact: Dr. Renaldo Fiddler Pager: [phone]  Second Contact: Dr. Nedra Hai Pager: [phone]       After Hours (After 5p/  First Contact Pager: [phone]  weekends / holidays): Second Contact Pager: [phone]   Chief Complaint: Right-sided weakness and aphasia  History of Present Illness: Theresa Landry is an 86 y.o. F w/ PMHx bronchiectasis, asthma, GERD, essential HTN, adjustment disorder with anxious mood, and mild cognitive impairment, acquired hypothyroidism, rheumatoid arthritis, and bilateral sensorineural hearing loss presenting with aphasia and right-sided weakness. Per Theresa Landry's sister, Theresa Landry was last known normal yesterday around 8:00pm. Theresa Landry neighbor visited Theresa Landry at 8:00am this morning asking if Theresa Landry'd like to go to breakfast, and the Theresa Landry declined (unusual for Theresa Landry). It was unclear whether Theresa Landry was at Theresa Landry baseline or not at this point. Around 3:15pm this afternoon, Theresa Landry neighbor went by to check on Theresa Landry again, and found that Theresa Landry had right arm weakness and trouble forming sentences.  EMS were called, brought Theresa Landry to the hospital, and activated code stroke. History is limited secondary to aphasia, but Theresa Landry states that Theresa Landry would like to sleep and endorses mild nausea and dizziness (worst during CN examination). Theresa Landry also endorses lower abdominal pain, stating that Theresa Landry needs to urinate. Theresa Landry also endorses right arm pain at the site of Theresa Landry IV. Theresa Landry denies any numbness.   Theresa Landry's son, Theresa Landry is Theresa Landry power of attorney / healthcare Proxy. Theresa Landry previously lived with him in [place] up until about 2.5 years ago and now lives at home alone. Theresa Landry continues to function semi-independently at home and had been administering Theresa Landry own home  medications without assistance, although Theresa Landry son manages Theresa Landry finances and Theresa Landry requires cleaning assistance at home secondary to cognitive impairment. Son and sister state that Theresa Landry is generally oriented to family/friends, time and place at baseline. They do say Theresa Landry is very hard of hearing. Theresa Landry's daughter recalls an ophthalmologist telling Theresa Landry that pt had a "history of stroke in Theresa Landry eyes, with double vision" upon lateral gaze.  Meds:  Donepezil 10 mg qd Lexapro 20 mg qd flonase singulair 10 mg qd Albuterol inhaler 2 puffs q6h prn  Cardizem CD 240 mg 24 hr Synthroid 25 mcg qam Meclizine 25 mg TID prn dizziness Protonix 20 mg qd  Symbicort 160-4.5 mcg/ACT 2 puffs bid  Allergies: Allergies as of 07/09/2020  . (No Known Allergies)   Family History:  Father - died of CVA Mother - died of "heart problems" with a history of angina.  Social History: Per Theresa Landry's sister, Theresa Landry has never used tobacco products, does drink 1 glass of scotch or wine nightly, and does not use any other drugs. Theresa Landry has two master's degrees, [employer] and liberal studies  History is limited secondary to aphasia. Admission history was provided by Theresa Landry's sister, Theresa Landry, and Theresa Landry's son, Theresa Landry: [phone].   Review of Systems: A complete ROS was negative except as per HPI.   Physical Exam: Blood pressure (!) 134/117, pulse (!) 58, temperature 99.1 F (37.3 C), temperature source Oral, resp. rate 20, height 5' (1.524 m), weight 62.5 kg,  SpO2 97 %. General: Theresa Landry appears well. No acute distress. Eyes: Sclera non-icteric. No conjunctival injection.  HENT: Neck is supple. No nasal discharge. Respiratory: There are minimal expiratory wheezes present bilaterally but otherwise lungs are CTA without rales or rhonchi.  Cardiovascular: Regular rate and rhythm. No murmurs, rubs, or gallops. No lower extremity edema. Distal pulses are 2+ in all four extremities.  Neurologic:  Cranial  nerves II-XII intact, although Theresa Landry has intermittent right hemi-neglect. Theresa Landry experiences dizziness on right lateral gaze, but does not have nystagmus. Motor: there is increased tone throughout all extremities. Normal muscle bulk. There is mild pronator drift of the right upper extremity. Grip strength in RUE is 4/5. Strength is 5/5 in LUE, and bilateral lower extremities.   Sensory: Sensation is grossly intact throughout. Reflexes: 2+ patellar reflexes, bilaterally.  Coordination: There is ataxia on finger-nose-finger testing, bilaterally. Unable to assess lower extremity coordination secondary to AMS.  Language: Theresa Landry has expressive aphasia and repetition is not intact. No receptive aphasia.  Abdominal: Soft and non-distended. There is mild suprapubic tenderness without guarding or rebound. Bowel sounds intact.  Skin: There are punctate areas of ecchymosis present on bilateral shins. No other rashes or lesions.  Psych: Theresa Landry appears anxious but is cooperative.  EKG: personally reviewed my interpretation is sinus tachycardia at a rate ~ 72bpm with borderline prolonged PR interval consistent with first degree AV block, also noted on prior EKG from 05/21/2019. QTc is 438.   CXR:  Consolidative opacity in the periphery of the right lower lobe centered upon several thickened airways, suspicious for pneumonia or aspiration.  Electronically Signed   By: Kreg Shropshire M.D.   On: 07/09/2020 18:34  MRI HEAD WITHOUT CONTRAST  FINDINGS: Brain: Multiple small areas of acute infarct in the left MCA territory. This involves the left frontal lobe and portions the left parietal lobe. There is acute infarct in the left caudate body.  Small area of hemorrhage in the left parietal cortex in the area of acute infarct. This is likely acute hemorrhage however is not hyperdense on CT.  Electronically Signed   By: Marlan Palau M.D.   On: 07/09/2020 20:46  Addendum 07/10/20 at 6:23am: clarified with Dr.  Grace Isaac that infarcts in the left MCA territory appear acute with acute hemorrhage.   CTA Neck There are calcified plaques at the origins of the bilateral ICA's causing < 50% stenosis. Calcified plaque also present along the aortic arch and great vessel origins.   Electronically Signed   By: Guadlupe Spanish M.D.   On: 07/09/2020 17:05  Assessment & Plan by Problem: Active Problems:   CVA (cerebral vascular accident) (HCC)  1. Expressive aphasia 2/2 multiple, acute left MCA and left caudate body infarcts Theresa Landry has acute expressive aphasia without dysarthria. Repetition is not intact. Theresa Landry also has intermittent receptive aphasia vs. Confusion in the setting of baseline mild cognitive impairment on Donepezil. Brain MRI showed multiple small areas of acute infarct in the left MCA distribution and left caudate body with a small area of acute hemorrhage in the left parietal cortex. - Will check BMP, Mg2+, CBC, lipid panel and HbA1c in the setting of possible confusion and for stroke work-up. - discussed with neuro, hold asa for now; SCDs - Frequent neuro checks  - Will get PT/OT/SLP evaluation  - Will get ECHO  - Will get EEG  2. 1st Degree AV Block EKG demonstrates prolonged PR interval consistent with prior EKG from 05/21/2019. - Placed on continuous telemetry monitoring  3.  Stable COPD Theresa Landry has mild expiratory wheezing present bilaterally. Otherwise, lungs CTA, bilaterally without tachypnea. SpO2 97% on room air.  - Albuterol nebulizers q6 hours PRN for wheezing/SOB - Dulera 2puffs BID   4. Essential HTN Theresa Landry has been hypertensive since initial presentation with most recent BP 180/82.  - home Cardizem held due to bradycardia - Give antihypertensives with SBP goal <166mmHg to prevent further hemorrhage  5. Nausea Likely in the setting of acute stroke.  - Will give Odansetron 4mg  tablet or injection q6 hrs PRN  6. Dizziness Likely in the setting of acute stroke. No nystagmus.    - Will cont home meclizine 25mg  TID PRN   7. Possible Urinary Retention Theresa Landry has mild suprapubic tenderness to palpation without guarding or rebound, consistent with urinary retention.  - Will get routine bladder scan q4 hours with instruction for RN to page if >300cc's   8. Suspected Aspiration CXR showed consolidative opacity in the periphery of the RLL, centered upon several thickened airways, suspicious for PNA or aspiration. Theresa Landry is afebrile, without cough. Given setting of acute CVA and history of cognitive impairment, aspiration more likely.  - Will hold antibiotics.   9. Acquired Hypothyroidism - Will check TSH - Continue home levothyrozine daily   10. GERD - Pantoprazole EC 20mg  daily   Diet: NPO DVT Prophylaxis: SCDs    Dispo: Admit Theresa Landry to Inpatient with expected length of stay greater than 2 midnights.  Signed: Glenford Bayley, MD 07/10/20, 12:43am Pager: [phone]

## 2020-07-10 ENCOUNTER — Other Ambulatory Visit: Payer: Self-pay

## 2020-07-10 ENCOUNTER — Inpatient Hospital Stay (HOSPITAL_COMMUNITY): Payer: Medicare Other

## 2020-07-10 DIAGNOSIS — R569 Unspecified convulsions: Secondary | ICD-10-CM

## 2020-07-10 DIAGNOSIS — R4701 Aphasia: Secondary | ICD-10-CM

## 2020-07-10 DIAGNOSIS — I619 Nontraumatic intracerebral hemorrhage, unspecified: Secondary | ICD-10-CM | POA: Diagnosis present

## 2020-07-10 DIAGNOSIS — I6389 Other cerebral infarction: Secondary | ICD-10-CM

## 2020-07-10 DIAGNOSIS — R410 Disorientation, unspecified: Secondary | ICD-10-CM

## 2020-07-10 LAB — LIPID PANEL
Cholesterol: 186 mg/dL (ref 0–200)
HDL: 69 mg/dL (ref >40)
LDL Cholesterol: 105 mg/dL — ABNORMAL HIGH (ref 0–99)
Total CHOL/HDL Ratio: 2.7 RATIO
Triglycerides: 61 mg/dL (ref <150)
VLDL: 12 mg/dL (ref 0–40)

## 2020-07-10 LAB — CBC
HCT: 35.2 % — ABNORMAL LOW (ref 36.0–46.0)
Hemoglobin: 11.5 g/dL — ABNORMAL LOW (ref 12.0–15.0)
MCH: 32.8 pg (ref 26.0–34.0)
MCHC: 32.7 g/dL (ref 30.0–36.0)
MCV: 100.3 fL — ABNORMAL HIGH (ref 80.0–100.0)
Platelets: 222 10*3/uL (ref 150–400)
RBC: 3.51 MIL/uL — ABNORMAL LOW (ref 3.87–5.11)
RDW: 14.5 % (ref 11.5–15.5)
WBC: 11.1 10*3/uL — ABNORMAL HIGH (ref 4.0–10.5)
nRBC: 0 % (ref 0.0–0.2)

## 2020-07-10 LAB — BASIC METABOLIC PANEL
Anion gap: 10 (ref 5–15)
BUN: 8 mg/dL (ref 8–23)
CO2: 23 mmol/L (ref 22–32)
Calcium: 9.3 mg/dL (ref 8.9–10.3)
Chloride: 101 mmol/L (ref 98–111)
Creatinine, Ser: 0.63 mg/dL (ref 0.44–1.00)
GFR calc Af Amer: >60 mL/min (ref >60)
GFR calc non Af Amer: >60 mL/min (ref >60)
Glucose, Bld: 123 mg/dL — ABNORMAL HIGH (ref 70–99)
Potassium: 3.4 mmol/L — ABNORMAL LOW (ref 3.5–5.1)
Sodium: 134 mmol/L — ABNORMAL LOW (ref 135–145)

## 2020-07-10 LAB — ECHOCARDIOGRAM COMPLETE
AR max vel: 1.68 cm2
AV Area VTI: 1.9 cm2
AV Area mean vel: 1.61 cm2
AV Mean grad: 5 mmHg
AV Peak grad: 9.6 mmHg
Ao pk vel: 1.55 m/s
Area-P 1/2: 2.48 cm2
Height: 60 in
S' Lateral: 2.7 cm
Weight: 2204.6 oz

## 2020-07-10 LAB — HEMOGLOBIN A1C
Hgb A1c MFr Bld: 5.6 % (ref 4.8–5.6)
Mean Plasma Glucose: 114.02 mg/dL

## 2020-07-10 LAB — MAGNESIUM: Magnesium: 1.9 mg/dL (ref 1.7–2.4)

## 2020-07-10 LAB — TSH: TSH: 1.932 u[IU]/mL (ref 0.350–4.500)

## 2020-07-10 MED ORDER — AMLODIPINE BESYLATE 5 MG PO TABS
5.0000 mg | ORAL_TABLET | Freq: Every day | ORAL | Status: DC
  Administered 2020-07-10 – 2020-07-17 (×7): 5 mg via ORAL
  Filled 2020-07-10 (×8): qty 1

## 2020-07-10 MED ORDER — HYDRALAZINE HCL 20 MG/ML IJ SOLN
5.0000 mg | Freq: Once | INTRAMUSCULAR | Status: AC
  Administered 2020-07-10: 5 mg via INTRAVENOUS
  Filled 2020-07-10: qty 1

## 2020-07-10 MED ORDER — ATORVASTATIN CALCIUM 80 MG PO TABS
80.0000 mg | ORAL_TABLET | Freq: Every day | ORAL | Status: DC
  Administered 2020-07-10 – 2020-07-17 (×8): 80 mg via ORAL
  Filled 2020-07-10 (×8): qty 1

## 2020-07-10 MED ORDER — CLOPIDOGREL BISULFATE 75 MG PO TABS
75.0000 mg | ORAL_TABLET | Freq: Every day | ORAL | Status: DC
  Administered 2020-07-10 – 2020-07-17 (×8): 75 mg via ORAL
  Filled 2020-07-10 (×8): qty 1

## 2020-07-10 MED ORDER — HYDRALAZINE HCL 20 MG/ML IJ SOLN: 10.0000 mg | INTRAMUSCULAR | Status: DC | PRN

## 2020-07-10 MED ORDER — ASPIRIN EC 81 MG PO TBEC
81.0000 mg | DELAYED_RELEASE_TABLET | Freq: Every day | ORAL | Status: DC
  Administered 2020-07-10 – 2020-07-17 (×8): 81 mg via ORAL
  Filled 2020-07-10 (×8): qty 1

## 2020-07-10 MED ORDER — POTASSIUM CHLORIDE 10 MEQ/100ML IV SOLN
10.0000 meq | INTRAVENOUS | Status: AC
  Administered 2020-07-10 (×2): 10 meq via INTRAVENOUS
  Filled 2020-07-10 (×2): qty 100

## 2020-07-10 MED ORDER — HYDRALAZINE HCL 20 MG/ML IJ SOLN: 5.0000 mg | INTRAMUSCULAR | Status: DC | PRN

## 2020-07-10 NOTE — Procedures (Signed)
Patient Name: Chloey Ricard  MRN: [id number]  Epilepsy Attending: Charlsie Quest  Referring Physician/Provider: Dr. Delia Heady Date: 07/09/2020 Duration: 24.55 minutes  Patient history: 71 old female who presented with altered mental status and speech disturbance.  EEG to assess for seizures.  Level of alertness: Awake, asleep  AEDs during EEG study: None  Technical aspects: This EEG study was done with scalp electrodes positioned according to the 10-20 International system of electrode placement. Electrical activity was acquired at a sampling rate of 500Hz  and reviewed with a high frequency filter of 70Hz  and a low frequency filter of 1Hz . EEG data were recorded continuously and digitally stored.   Description: During awake state, no clear posterior dominant rhythm was seen.  Sleep was characterized by vertex waves, sleep spindles (12 to 14 Hz), maximal frontocentral region.  EEG showed continuous generalized 3 to 5 Hz theta-delta slowing.  Hyperventilation and photic stimulation were not performed.     ABNORMALITY -Continuous slow, generalized  IMPRESSION: This study is suggestive of moderate diffuse encephalopathy, nonspecific to etiology.  No seizures or epileptiform discharges were seen throughout the recording.   Khamiya Varin 

## 2020-07-10 NOTE — Progress Notes (Signed)
STROKE TEAM PROGRESS NOTE   INTERVAL HISTORY Patient up in chair, staring out window to the L. Significant R neglect. Eating lunch.  I personally reviewed history of presenting illness, electronic medical records and imaging films in PACS.  MRI scan shows patchy left MCA branch infarcts while CT angiogram shows less than 50% left proximal carotid stenosis.  Echocardiogram is pending.  LDL cholesterol is 105 mg percent and hemoglobin A1c is 5.6. Vitals:   07/09/20 2329 07/10/20 0337 07/10/20 0700 07/10/20 1147  BP: (!) 163/89 (!) 170/72 (!) 172/61 126/75  Pulse: 73 (!) 54 (!) 57 71  Resp: Temp: 98 F (36.7 C) 98 F (36.7 C) 98.3 F (36.8 C) 98.4 F (36.9 C)  TempSrc: Oral Oral Oral Oral  SpO2: 98% 96% 95% 96%  Weight:      Height:       CBC:  Recent Labs  Lab 07/09/20 1636 07/09/20 1636 07/09/20 1641 07/10/20 0235  WBC 10.2  --   --  11.1*  NEUTROABS 4.4  --   --   --   HGB 11.9*   < > 12.9 11.5*  HCT 36.7   < > 38.0 35.2*  MCV 101.9*  --   --  100.3*  PLT 225  --   --  222   < > = values in this interval not displayed.   Basic Metabolic Panel:  Recent Labs  Lab 07/09/20 1636 07/09/20 1636 07/09/20 1641 07/10/20 0235  NA 136   < > 139 134*  K 3.8   < > 4.0 3.4*  CL 103   < > 102 101  CO2 23  --   --  23  GLUCOSE 103*   < > 101* 123*  BUN 11   < > 11 8  CREATININE 0.72   < > 0.60 0.63  CALCIUM 9.5  --   --  9.3  MG  --   --   --  1.9   < > = values in this interval not displayed.   Lipid Panel:  Recent Labs  Lab 07/10/20 0235  CHOL 186  TRIG 61  HDL 69  CHOLHDL 2.7  VLDL 12  LDLCALC 161*   HgbA1c:  Recent Labs  Lab 07/10/20 0235  HGBA1C 5.6   Urine Drug Screen: No results for input(s): LABOPIA, COCAINSCRNUR, LABBENZ, AMPHETMU, THCU, LABBARB in the last 168 hours.  Alcohol Level No results for input(s): ETH in the last 168 hours.  IMAGING past 24 hours CT Code Stroke CTA Head W/WO contrast  Result Date: 07/09/2020 CLINICAL DATA:   Code stroke.  Right-sided weakness EXAM: CT HEAD CODE STROKE WITHOUT CT ANGIOGRAPHY HEAD AND NECK CT PERFUSION BRAIN TECHNIQUE: Multidetector CT imaging of the head and neck was performed using the standard protocol during bolus administration of intravenous contrast. Multiplanar CT image reconstructions and MIPs were obtained to evaluate the vascular anatomy. Carotid stenosis measurements (when applicable) are obtained utilizing NASCET criteria, using the distal internal carotid diameter as the denominator. Multiphase CT imaging of the brain was performed following IV bolus contrast injection. Subsequent parametric perfusion maps were calculated using RAPID software. CONTRAST:  100 mL Omnipaque 350 COMPARISON:  None. FINDINGS: CT HEAD FINDINGS Brain: There is no acute intracranial hemorrhage, mass effect, or edema. Gray-white differentiation is preserved. Ventricles and sulci are prominent reflecting generalized parenchymal volume loss. Patchy hypoattenuation in the supratentorial white matter is nonspecific but probably reflects chronic microvascular ischemic changes. Vascular: There is no  hyperdense vessel. Skull: Unremarkable. Sinuses/Orbits: No acute abnormality. Other: None. ASPECTS (Alberta Stroke Program Early CT Score) - Ganglionic level infarction (caudate, lentiform nuclei, internal capsule, insula, M1-M3 cortex): 7 - Supraganglionic infarction (M4-M6 cortex): 3 Total score (0-10 with 10 being normal): 10 Review of the MIP images confirms the above findings CTA NECK FINDINGS Aortic arch: Calcified plaque is present along the arch and at the patent great vessel origins. Right carotid system: Patent. There is calcified plaque at the ICA origin causing less than 50% stenosis. Left carotid system: Patent. There is calcified plaque at the ICA origin causing less than 50% stenosis. Vertebral arteries: Patent.  Left vertebral artery is dominant. Skeleton: Multilevel degenerative changes of the cervical spine.  Other neck: No mass or adenopathy. Upper chest: No apical lung mass. Review of the MIP images confirms the above findings CTA HEAD FINDINGS Anterior circulation: Intracranial internal carotid arteries are patent with calcified plaque causing mild stenosis. Right middle and both anterior cerebral arteries are patent. Left M1 MCA is patent. Proximal left M2 branches are patent. There is diminished opacification in a vessel adjacent to a left M2 branch within the sylvian fissure, which is favored to be venous particularly given perfusion findings. Posterior circulation: Intracranial vertebral arteries are patent with mild calcified plaque on the right. PICA origins are patent. Basilar artery is patent. Superior cerebellar origins are patent. Posterior cerebral arteries are patent. Venous sinuses: Not well evaluated Review of the MIP images confirms the above findings CT Brain Perfusion Findings: CBF (<30%) Volume: 0mL Perfusion (Tmax>6.0s) volume: 0mL Mismatch Volume: 0mL Infarction Location:None IMPRESSION: No acute intracranial hemorrhage or evidence of acute infarction. ASPECT score is 10. Perfusion imaging demonstrates no evidence of core infarction or penumbra. No large vessel occlusion. Diminished opacification of a vessel visually contiguous with a left M2 MCA within the sylvian fissure is favored to be venous particularly given perfusion findings Calcified plaque at the ICA origins causing less than 50% stenosis. These results were communicated to Dr. Pearlean Brownie at 5:00 pmon 8/2/2021by text page via the [facility] messaging system. Electronically Signed   By: Guadlupe Spanish M.D.   On: 07/09/2020 17:05   DG Chest 1 View  Result Date: 07/09/2020 CLINICAL DATA:  MRI screening EXAM: CHEST  1 VIEW COMPARISON:  Radiograph 05/21/2019, CT 05/03/2019 FINDINGS: Consolidative opacity is seen in the periphery of the right lower lobe centered upon several thickened airways. No pneumothorax or visible effusion. The aorta is  calcified. The remaining cardiomediastinal contours are unremarkable. No acute osseous or soft tissue abnormality. Degenerative changes are present in the imaged spine and shoulders. Rounded button leads and telemetry wires overlie the chest as well as a metallic fastener projecting over the right shoulder likely related to garment. No other radiopaque foreign bodies. IMPRESSION: Consolidative opacity in the periphery of the right lower lobe centered upon several thickened airways, suspicious for pneumonia or aspiration. Telemetry button leads and wires projecting over the chest. Metallic fastener over the right shoulder related to garment. Electronically Signed   By: Kreg Shropshire M.D.   On: 07/09/2020 18:34   DG Abdomen 1 View  Result Date: 07/09/2020 CLINICAL DATA:  Screening for MRI. EXAM: ABDOMEN - 1 VIEW COMPARISON:  None. FINDINGS: There is no definite concerning metallic foreign body identified on this study. Excreted contrast is noted within the collecting system and urinary bladder. The bowel gas pattern is nonobstructive. There are degenerative changes of the hips and spine. IMPRESSION: No definite metallic foreign body identified on this study.  Excreted contrast is noted in the collecting system and urinary bladder. Electronically Signed   By: Katherine Mantle M.D.   On: 07/09/2020 18:34   CT Code Stroke CTA Neck W/WO contrast  Result Date: 07/09/2020 CLINICAL DATA:  Code stroke.  Right-sided weakness EXAM: CT HEAD CODE STROKE WITHOUT CT ANGIOGRAPHY HEAD AND NECK CT PERFUSION BRAIN TECHNIQUE: Multidetector CT imaging of the head and neck was performed using the standard protocol during bolus administration of intravenous contrast. Multiplanar CT image reconstructions and MIPs were obtained to evaluate the vascular anatomy. Carotid stenosis measurements (when applicable) are obtained utilizing NASCET criteria, using the distal internal carotid diameter as the denominator. Multiphase CT imaging of  the brain was performed following IV bolus contrast injection. Subsequent parametric perfusion maps were calculated using RAPID software. CONTRAST:  100 mL Omnipaque 350 COMPARISON:  None. FINDINGS: CT HEAD FINDINGS Brain: There is no acute intracranial hemorrhage, mass effect, or edema. Gray-white differentiation is preserved. Ventricles and sulci are prominent reflecting generalized parenchymal volume loss. Patchy hypoattenuation in the supratentorial white matter is nonspecific but probably reflects chronic microvascular ischemic changes. Vascular: There is no hyperdense vessel. Skull: Unremarkable. Sinuses/Orbits: No acute abnormality. Other: None. ASPECTS (Alberta Stroke Program Early CT Score) - Ganglionic level infarction (caudate, lentiform nuclei, internal capsule, insula, M1-M3 cortex): 7 - Supraganglionic infarction (M4-M6 cortex): 3 Total score (0-10 with 10 being normal): 10 Review of the MIP images confirms the above findings CTA NECK FINDINGS Aortic arch: Calcified plaque is present along the arch and at the patent great vessel origins. Right carotid system: Patent. There is calcified plaque at the ICA origin causing less than 50% stenosis. Left carotid system: Patent. There is calcified plaque at the ICA origin causing less than 50% stenosis. Vertebral arteries: Patent.  Left vertebral artery is dominant. Skeleton: Multilevel degenerative changes of the cervical spine. Other neck: No mass or adenopathy. Upper chest: No apical lung mass. Review of the MIP images confirms the above findings CTA HEAD FINDINGS Anterior circulation: Intracranial internal carotid arteries are patent with calcified plaque causing mild stenosis. Right middle and both anterior cerebral arteries are patent. Left M1 MCA is patent. Proximal left M2 branches are patent. There is diminished opacification in a vessel adjacent to a left M2 branch within the sylvian fissure, which is favored to be venous particularly given perfusion  findings. Posterior circulation: Intracranial vertebral arteries are patent with mild calcified plaque on the right. PICA origins are patent. Basilar artery is patent. Superior cerebellar origins are patent. Posterior cerebral arteries are patent. Venous sinuses: Not well evaluated Review of the MIP images confirms the above findings CT Brain Perfusion Findings: CBF (<30%) Volume: 57mL Perfusion (Tmax>6.0s) volume: 62mL Mismatch Volume: 31mL Infarction Location:None IMPRESSION: No acute intracranial hemorrhage or evidence of acute infarction. ASPECT score is 10. Perfusion imaging demonstrates no evidence of core infarction or penumbra. No large vessel occlusion. Diminished opacification of a vessel visually contiguous with a left M2 MCA within the sylvian fissure is favored to be venous particularly given perfusion findings Calcified plaque at the ICA origins causing less than 50% stenosis. These results were communicated to Dr. Pearlean Brownie at 5:00 pmon 8/2/2021by text page via the [facility] messaging system. Electronically Signed   By: Guadlupe Spanish M.D.   On: 07/09/2020 17:05   MR BRAIN WO CONTRAST  Result Date: 07/09/2020 CLINICAL DATA:  Stroke follow-up. EXAM: MRI HEAD WITHOUT CONTRAST TECHNIQUE: Multiplanar, multiecho pulse sequences of the brain and surrounding structures were obtained without intravenous contrast. COMPARISON:  CT angio head and neck 07/09/2020 FINDINGS: Brain: Multiple small areas of chronic infarct in the left MCA territory. This involves the left frontal lobe and portions the left parietal lobe. There is acute infarct in the left caudate body. Small area of hemorrhage in the left parietal cortex in the area of acute infarct. This is likely acute hemorrhage however is not hyperdense on CT. Vascular: Normal arterial flow voids Skull and upper cervical spine: Negative Sinuses/Orbits: Mucosal edema paranasal sinuses, moderate mucosal edema paranasal sinuses without air-fluid level. No orbital lesion.   Bilateral cataract extraction Other: None IMPRESSION: Multiple small areas of acute infarct in the left MCA distribution as above. Small area of hemorrhage in the left parietal cortex which may be recent however is not hyperdense on CT. This is likely due to the small volume of blood. These results were called by telephone at the time of interpretation on 07/09/2020 at 8:45 pm to provider Aroor , who verbally acknowledged these results. Electronically Signed   By: Marlan Palau M.D.   On: 07/09/2020 20:46   CT Code Stroke Cerebral Perfusion with contrast  Result Date: 07/09/2020 CLINICAL DATA:  Code stroke.  Right-sided weakness EXAM: CT HEAD CODE STROKE WITHOUT CT ANGIOGRAPHY HEAD AND NECK CT PERFUSION BRAIN TECHNIQUE: Multidetector CT imaging of the head and neck was performed using the standard protocol during bolus administration of intravenous contrast. Multiplanar CT image reconstructions and MIPs were obtained to evaluate the vascular anatomy. Carotid stenosis measurements (when applicable) are obtained utilizing NASCET criteria, using the distal internal carotid diameter as the denominator. Multiphase CT imaging of the brain was performed following IV bolus contrast injection. Subsequent parametric perfusion maps were calculated using RAPID software. CONTRAST:  100 mL Omnipaque 350 COMPARISON:  None. FINDINGS: CT HEAD FINDINGS Brain: There is no acute intracranial hemorrhage, mass effect, or edema. Gray-white differentiation is preserved. Ventricles and sulci are prominent reflecting generalized parenchymal volume loss. Patchy hypoattenuation in the supratentorial white matter is nonspecific but probably reflects chronic microvascular ischemic changes. Vascular: There is no hyperdense vessel. Skull: Unremarkable. Sinuses/Orbits: No acute abnormality. Other: None. ASPECTS (Alberta Stroke Program Early CT Score) - Ganglionic level infarction (caudate, lentiform nuclei, internal capsule, insula, M1-M3  cortex): 7 - Supraganglionic infarction (M4-M6 cortex): 3 Total score (0-10 with 10 being normal): 10 Review of the MIP images confirms the above findings CTA NECK FINDINGS Aortic arch: Calcified plaque is present along the arch and at the patent great vessel origins. Right carotid system: Patent. There is calcified plaque at the ICA origin causing less than 50% stenosis. Left carotid system: Patent. There is calcified plaque at the ICA origin causing less than 50% stenosis. Vertebral arteries: Patent.  Left vertebral artery is dominant. Skeleton: Multilevel degenerative changes of the cervical spine. Other neck: No mass or adenopathy. Upper chest: No apical lung mass. Review of the MIP images confirms the above findings CTA HEAD FINDINGS Anterior circulation: Intracranial internal carotid arteries are patent with calcified plaque causing mild stenosis. Right middle and both anterior cerebral arteries are patent. Left M1 MCA is patent. Proximal left M2 branches are patent. There is diminished opacification in a vessel adjacent to a left M2 branch within the sylvian fissure, which is favored to be venous particularly given perfusion findings. Posterior circulation: Intracranial vertebral arteries are patent with mild calcified plaque on the right. PICA origins are patent. Basilar artery is patent. Superior cerebellar origins are patent. Posterior cerebral arteries are patent. Venous sinuses: Not well evaluated Review of the MIP  images confirms the above findings CT Brain Perfusion Findings: CBF (<30%) Volume: 38mL Perfusion (Tmax>6.0s) volume: 70mL Mismatch Volume: 19mL Infarction Location:None IMPRESSION: No acute intracranial hemorrhage or evidence of acute infarction. ASPECT score is 10. Perfusion imaging demonstrates no evidence of core infarction or penumbra. No large vessel occlusion. Diminished opacification of a vessel visually contiguous with a left M2 MCA within the sylvian fissure is favored to be venous  particularly given perfusion findings Calcified plaque at the ICA origins causing less than 50% stenosis. These results were communicated to Dr. Pearlean Brownie at 5:00 pmon 8/2/2021by text page via the [facility] messaging system. Electronically Signed   By: Guadlupe Spanish M.D.   On: 07/09/2020 17:05   EEG adult  Result Date: 07/10/2020 Charlsie Quest, MD     07/10/2020  9:50 AM Patient Name: Latima Common MRN: [id number] Epilepsy Attending: Charlsie Quest Referring Physician/Provider: Dr. Delia Heady Date: 07/09/2020 Duration: 24.55 minutes Patient history: 48 old female who presented with altered mental status and speech disturbance.  EEG to assess for seizures. Level of alertness: Awake, asleep AEDs during EEG study: None Technical aspects: This EEG study was done with scalp electrodes positioned according to the 10-20 International system of electrode placement. Electrical activity was acquired at a sampling rate of 500Hz  and reviewed with a high frequency filter of 70Hz  and a low frequency filter of 1Hz . EEG data were recorded continuously and digitally stored. Description: During awake state, no clear posterior dominant rhythm was seen.  Sleep was characterized by vertex waves, sleep spindles (12 to 14 Hz), maximal frontocentral region.  EEG showed continuous generalized 3 to 5 Hz theta-delta slowing.  Hyperventilation and photic stimulation were not performed.   ABNORMALITY -Continuous slow, generalized IMPRESSION: This study is suggestive of moderate diffuse encephalopathy, nonspecific to etiology.  No seizures or epileptiform discharges were seen throughout the recording. Charlsie Quest   CT HEAD CODE STROKE WO CONTRAST  Result Date: 07/09/2020 CLINICAL DATA:  Code stroke.  Right-sided weakness EXAM: CT HEAD CODE STROKE WITHOUT CT ANGIOGRAPHY HEAD AND NECK CT PERFUSION BRAIN TECHNIQUE: Multidetector CT imaging of the head and neck was performed using the standard protocol during bolus administration of  intravenous contrast. Multiplanar CT image reconstructions and MIPs were obtained to evaluate the vascular anatomy. Carotid stenosis measurements (when applicable) are obtained utilizing NASCET criteria, using the distal internal carotid diameter as the denominator. Multiphase CT imaging of the brain was performed following IV bolus contrast injection. Subsequent parametric perfusion maps were calculated using RAPID software. CONTRAST:  100 mL Omnipaque 350 COMPARISON:  None. FINDINGS: CT HEAD FINDINGS Brain: There is no acute intracranial hemorrhage, mass effect, or edema. Gray-white differentiation is preserved. Ventricles and sulci are prominent reflecting generalized parenchymal volume loss. Patchy hypoattenuation in the supratentorial white matter is nonspecific but probably reflects chronic microvascular ischemic changes. Vascular: There is no hyperdense vessel. Skull: Unremarkable. Sinuses/Orbits: No acute abnormality. Other: None. ASPECTS (Alberta Stroke Program Early CT Score) - Ganglionic level infarction (caudate, lentiform nuclei, internal capsule, insula, M1-M3 cortex): 7 - Supraganglionic infarction (M4-M6 cortex): 3 Total score (0-10 with 10 being normal): 10 Review of the MIP images confirms the above findings CTA NECK FINDINGS Aortic arch: Calcified plaque is present along the arch and at the patent great vessel origins. Right carotid system: Patent. There is calcified plaque at the ICA origin causing less than 50% stenosis. Left carotid system: Patent. There is calcified plaque at the ICA origin causing less than 50% stenosis. Vertebral arteries: Patent.  Left vertebral artery is dominant. Skeleton: Multilevel degenerative changes of the cervical spine. Other neck: No mass or adenopathy. Upper chest: No apical lung mass. Review of the MIP images confirms the above findings CTA HEAD FINDINGS Anterior circulation: Intracranial internal carotid arteries are patent with calcified plaque causing mild  stenosis. Right middle and both anterior cerebral arteries are patent. Left M1 MCA is patent. Proximal left M2 branches are patent. There is diminished opacification in a vessel adjacent to a left M2 branch within the sylvian fissure, which is favored to be venous particularly given perfusion findings. Posterior circulation: Intracranial vertebral arteries are patent with mild calcified plaque on the right. PICA origins are patent. Basilar artery is patent. Superior cerebellar origins are patent. Posterior cerebral arteries are patent. Venous sinuses: Not well evaluated Review of the MIP images confirms the above findings CT Brain Perfusion Findings: CBF (<30%) Volume: 16mL Perfusion (Tmax>6.0s) volume: 26mL Mismatch Volume: 30mL Infarction Location:None IMPRESSION: No acute intracranial hemorrhage or evidence of acute infarction. ASPECT score is 10. Perfusion imaging demonstrates no evidence of core infarction or penumbra. No large vessel occlusion. Diminished opacification of a vessel visually contiguous with a left M2 MCA within the sylvian fissure is favored to be venous particularly given perfusion findings Calcified plaque at the ICA origins causing less than 50% stenosis. These results were communicated to Dr. Pearlean Brownie at 5:00 pmon 8/2/2021by text page via the [facility] messaging system. Electronically Signed   By: Guadlupe Spanish M.D.   On: 07/09/2020 17:05    PHYSICAL EXAM Pleasant elderly Caucasian lady sitting comfortably in bed.  Not in distress. . Afebrile. Head is nontraumatic. Neck is supple without bruit.    Cardiac exam no murmur or gallop. Lungs are clear to auscultation. Distal pulses are well felt. Neurological Exam :  She is awake alert oriented to 2 person and place.  Diminished attention, registration and recall.  Speech is slightly hesitant but mostly fluent without paraphasic errors.  Able to name and repeat well.  Follows simple 1 and occasional two-step commands.  Extraocular movements appear  full range but she has some slight left gaze preference but able to look to the right and asked to do so.  She has diminished vision acuity in the right eye and only finger counting at 2 to 3 feet.  She blinks to threat on the left but not on the right.  Face is symmetric without weakness.  Tongue midline.  Motor system exam shows symmetric upper and lower extremity strength without drift or focal weakness.  Finger-to-nose and needle coordination is slow but accurate.  Touch pinprick sensation are preserved.  She has mild right-sided inattention on double simultaneous testing.  Gait not tested. ASSESSMENT/PLAN Ms. Suvi Archuletta is a 86 y.o. female with history of HTN presenting with confusion and aphasia, R field cut, dysarthria and neglect.   Stroke:   Multiple small L MCA infarcts (mild petechial hemorrhage) embolic secondary to unknown source, suspicious for AF  Code Stroke CT head No acute abnormality. ASPECTS 10.     CTA head & neck no LVO. L M2 atherosclerosis. B ICA origins < 50% stenosis.  CT perfusion no core or penumbra  MRI  Multiple small L MCA infarct.small L parietal cortex petechial hemorrhage  2D Echo EF 60-65%. No source of embolus   EEG continuous slowing. No sz  Will ask a [facility] electrophysiologist will consult and consider placement of an implantable loop recorder to evaluate for atrial fibrillation as etiology  of stroke prior to d/c.  This has been explained to patient/family by Dr. Pearlean Brownie and they are agreeable.   LDL 105  HgbA1c 5.6  VTE prophylaxis - SCDs   No antithrombotic prior to admission, now on No antithrombotic. Add DAPT x 3 weeks then aspirin along  Therapy recommendations:  SNF  Disposition:  pending  (lived alone PTA)  Hypertension  Stable . Permissive hypertension (OK if < 220/120) but gradually normalize in 5-7 days . 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  Recommend loop recorder insertion for paroxysmal A. fib.  Discussed with electrophysiology team.  Aspirin and Plavix for 3 weeks followed by aspirin alone and aggressive risk factor modification.  Greater than 50% time during this 35-minute visit was spent on counseling and coordination of care and answering questions. Delia Heady, MD To contact Stroke Continuity provider, please refer to [email or web address]. After hours, contact General Neurology

## 2020-07-10 NOTE — Plan of Care (Signed)
  Problem: Clinical Measurements: Goal: Ability to maintain clinical measurements within normal limits will improve Outcome: Progressing Goal: Will remain free from infection Outcome: Progressing Goal: Diagnostic test results will improve Outcome: Progressing Goal: Respiratory complications will improve Outcome: Progressing Goal: Cardiovascular complication will be avoided Outcome: Progressing   Problem: Activity: Goal: Risk for activity intolerance will decrease Outcome: Progressing   Problem: Nutrition: Goal: Adequate nutrition will be maintained Outcome: Progressing   Problem: Coping: Goal: Level of anxiety will decrease Outcome: Progressing   Problem: Elimination: Goal: Will not experience complications related to bowel motility Outcome: Progressing Goal: Will not experience complications related to urinary retention Outcome: Progressing   Problem: Pain Managment: Goal: General experience of comfort will improve Outcome: Progressing   Problem: Safety: Goal: Ability to remain free from injury will improve Outcome: Progressing   Problem: Skin Integrity: Goal: Risk for impaired skin integrity will decrease Outcome: Progressing   Problem: Coping: Goal: Will verbalize positive feelings about self Outcome: Progressing Goal: Will identify appropriate support needs Outcome: Progressing   Problem: Self-Care: Goal: Ability to participate in self-care as condition permits will improve Outcome: Progressing Goal: Verbalization of feelings and concerns over difficulty with self-care will improve Outcome: Progressing Goal: Ability to communicate needs accurately will improve Outcome: Progressing   Problem: Nutrition: Goal: Risk of aspiration will decrease Outcome: Progressing Goal: Dietary intake will improve Outcome: Progressing   Problem: Ischemic Stroke/TIA Tissue Perfusion: Goal: Complications of ischemic stroke/TIA will be minimized Outcome: Progressing    Problem: Education: Goal: Knowledge of General Education information will improve Description: Including pain rating scale, medication(s)/side effects and non-pharmacologic comfort measures Outcome: Not Progressing Note: Remains confused this am.   Problem: Health Behavior/Discharge Planning: Goal: Ability to manage health-related needs will improve Outcome: Not Progressing   Problem: Education: Goal: Knowledge of disease or condition will improve Outcome: Not Progressing Note: Pt oriented to self only this am. Goal: Knowledge of secondary prevention will improve Outcome: Not Progressing Goal: Knowledge of patient specific risk factors addressed and post discharge goals established will improve Outcome: Not Progressing Goal: Individualized Educational Video(s) Outcome: Not Progressing   Problem: Health Behavior/Discharge Planning: Goal: Ability to manage health-related needs will improve Outcome: Not Progressing

## 2020-07-10 NOTE — Progress Notes (Addendum)
Subjective: Day 1 for Theresa Landry, an 86 y.o. female living with HTN admitted for acute left MCA cerebral ischemia.   Patient was interviewed at bedside. SLP was also present in the room performing swallow study at the beginning. Patient states that she knows what happened and that she just got confused. She has not been told about her recent stroke and we were the first team to deliver the news to her she reports. She is not sure what is bothering her, but reports that "something feels off". She can taste and smell fine and she requests [place] to call Theresa Landry, her neighbor.  Objective:  Vital signs in last 24 hours: Vitals:   07/09/20 2214 07/09/20 2329 07/10/20 0337 07/10/20 0700  BP: (!) 134/117 (!) 163/89 (!) 170/72 (!) 172/61  Pulse:  73 (!) 54 (!) 57  Resp: 20 18 19 18   Temp: 99.1 F (37.3 C) 98 F (36.7 C) 98 F (36.7 C) 98.3 F (36.8 C)  TempSrc: Oral Oral Oral Oral  SpO2: 97% 98% 96% 95%  Weight:      Height:       Weight change:   Intake/Output Summary (Last 24 hours) at 07/10/2020 1126 Last data filed at 07/10/2020 1000 Gross per 24 hour  Intake 1042.69 ml  Output 250 ml  Net 792.69 ml   Physical Exam: GEN: NAD, lying in bed ENT: White-gray exudate appreciated on tongue and around left buccal mucosa  CV: RRR, no murmurs, rubs, or gallops ABD: Soft and no tenderness noted on palpation  EXT: No edema NEURO:  Cranial nerves II-XII. Right-sided hemi-neglect. Did not assess pupillary response or hearing. Difficulty with protrusion of tongue. Otherwise all other CN's were intact.    Motor: Normal muscle bulk. Strength of extremities were intact.  Sensory: Difficulty with localizing sensation between right and left lower extremities. Decreased awareness of R environment and reduced sustained attention.  Coordination: Unable to perform finger-to-nose testing. Unable to assess lower extremity coordination secondary to AMS.  Language: Mild expressive aphasia  PSYCH: Cooperative  but confused and disoriented   Assessment/Plan: Day 1 for Theresa Landry, 86 y.o. female living with HTN admitted for acute left MCA cerebral ischemia.   Principal Problem:   CVA (cerebral vascular accident) [facility]) Active Problems:   Essential hypertension   Cerebral hemorrhage (HCC)  Expressive aphasia 2/2 acute left MCA cerebral infarction. Patient found to have expressive aphasia without dysarthria. Confusion has been present but this appears to be baseline for her. Imaging to date has not shown any significant LVOs, team believes this to be related to thrombotic small vessel disease. Plan to follow-up echocardiogram today to r/o embolic etiology. SLP suggested restarting regular solids & thin liquids for diet. Continue appreciating their recs. EEG study showed moderate diffuse encephalopathy, appears to be nonspecific. Greatly appreciate stroke team consultation. Restarted ASA, was previously held because of small hemorrhage found on MRI.  Plan:  --Continue BMP and CBC, K+ slightly low (3.4) will continue to monitor for changes in electrolytes  --Restarted ASA 81 mg oral daily  --Atorvastatin 80mg  daily --Frequent neuro checks  --Awaiting stroke team consult --Awaiting PT/OT evaluation  --Awaiting ECHO   Essential HTN. Patient has had intermittent hypertension throughout hospital course. Cardizem has been held due to concern of bradycardia. Placed on hydralazine injection 5 mg q4h PRN if BP>150 to prevent further hemorrhage. Added amlodipine 5 mg daily. Most recent BP was 126/75.  Plan:  --Holding Cardizem --Continue Amlodipine 5 mg daily --Continue Hydralazine injection 5  mg q4h PRN if BP>150.  COPD. Appears to be well controlled, was in no acute distress.  Plan:  --Continue Albuterol nebulizers q6h PRN for wheezing/SOB --Continue Dulera 2puffs BID   First Degree AV Block. Found to have a first degree AV block, appears to be a chronic issue give that ECG from 05/21/2019  demonstrated similar results. Plan:  --Continue telemetry monitoring    LOS: 1 day   Clemetine Marker, Medical Student 07/10/2020, 11:26 AM

## 2020-07-10 NOTE — Evaluation (Signed)
Physical Therapy Evaluation Patient Details Name: Theresa Landry MRN: [id number] DOB: 1933/08/03 Today's Date: 07/10/2020   History of Present Illness  Pt is a 86 y.o. female with PMH of bronchiectasis, asthma, GERD, essential HTN, adjustment disorder with anxious mood, and mild cognitive impairment, acquired hypothyroidism, rheumatoid arthritis, and bilateral sensorineural hearing loss presenting with aphasia and right-sided weakness. MRI showed multiple small areas of acute infarct in the left MCA distribution and left caudate body with a small area of acute hemorrhage in the left parietal cortex.  Clinical Impression  Pt was evaluated and assessed by PT/OT for the above diagnosis and impairments listed below. Pt was noted to have cognitive deficits and R sided inattention. Pt required supervision with all bed mobility. Pt required min guard to min assist with all transfers and ambulation with a RW. Pt had difficulty with RW sequencing and tended to run into objects on R side throughout mobility. Patient lives alone and reports assistance at home is intermittent. Given current deficits, recommend SNF level of therapy at d/c. Pt would continue to benefit from acute therapy services to ensure safety with functional tasks. Will continue to follow acutely.     Follow Up Recommendations SNF;Supervision/Assistance - 24 hour    Equipment Recommendations  None recommended by PT    Recommendations for Other Services       Precautions / Restrictions Precautions Precautions: Fall Restrictions Weight Bearing Restrictions: No      Mobility  Bed Mobility Overal bed mobility: Needs Assistance Bed Mobility: Supine to Sit     Supine to sit: Supervision;HOB elevated     General bed mobility comments: pt required supevision for safety with HOB elevated for bed mobility.  (Simultaneous filing. User may not have seen previous data.)  Transfers Overall transfer level: Needs assistance Equipment  used: Rolling walker (2 wheeled) Transfers: Sit to/from Stand Sit to Stand: Min assist         General transfer comment: pt required min assist for power up to standing with sit<>stand transfer   Ambulation/Gait Ambulation/Gait assistance: Min assist;Min guard;+2 safety/equipment Gait Distance (Feet): 15 Feet Assistive device: Rolling walker (2 wheeled) Gait Pattern/deviations: Step-through pattern;Decreased step length - right;Decreased step length - left;Decreased stride length;Decreased dorsiflexion - right;Decreased dorsiflexion - left Gait velocity: decreased   General Gait Details: pt demonstrated slowed gait with decreased stride length and step length. Pt was noted to have decreased heel strike with ambulation. pt required max multimodal cueing for RW sequencing and proximity to device.  Pt also cued for keeping upright posture in gait. Pt tended to run into objects on the R side secondary to R sided inattention and required max multimodal cues for assistance with obstacle navigation.   Stairs            Wheelchair Mobility    Modified Rankin (Stroke Patients Only) Modified Rankin (Stroke Patients Only) Pre-Morbid Rankin Score: Moderate disability Modified Rankin: Moderately severe disability     Balance Overall balance assessment: Needs assistance Sitting-balance support: No upper extremity supported;Feet supported Sitting balance-Leahy Scale: Fair Sitting balance - Comments: pt required min guard assist with sitting balance at EOB   Standing balance support: During functional activity;No upper extremity supported;Bilateral upper extremity supported Standing balance-Leahy Scale: Fair Standing balance comment: pt able to maintain standing balance at edge of sink without UE support                             Pertinent Vitals/Pain  Pain Assessment: Faces Faces Pain Scale: No hurt    Home Living Family/patient expects to be discharged to:: Private  residence Living Arrangements: Alone Available Help at Discharge: Friend(s);Personal care attendant;Available PRN/intermittently;Family Type of Home: House       Home Layout: One level Home Equipment: Walker - 2 wheels;Cane - single point Additional Comments: Pt limited history and no family to confirm support and home set up    Prior Function Level of Independence: Needs assistance   Gait / Transfers Assistance Needed: pt initially states she uses RW then corrects herself to state she uses cane  ADL's / Homemaking Assistance Needed: Pt manages own medicine but son in [place], [place] manages finances and hired someone to help with cleaning due to declining cognition  Comments: Obtained information from pt - limited historian     Hand Dominance   Dominant Hand: Right    Extremity/Trunk Assessment   Upper Extremity Assessment Upper Extremity Assessment: Defer to OT evaluation    Lower Extremity Assessment Lower Extremity Assessment: Difficult to assess due to impaired cognition;RLE deficits/detail;LLE deficits/detail;Generalized weakness RLE Deficits / Details: pt demonstrated AROM WFL at ankle, knee and hip LLE Deficits / Details: pt demonstrated AROM WFL for ankle, knee and hip    Cervical / Trunk Assessment Cervical / Trunk Assessment: Normal  Communication   Communication: Expressive difficulties;HOH  Cognition Arousal/Alertness: Awake/alert Behavior During Therapy: WFL for tasks assessed/performed Overall Cognitive Status: History of cognitive impairments - at baseline Area of Impairment: Orientation;Attention;Memory;Following commands;Safety/judgement;Awareness;Problem solving                 Orientation Level: Disoriented to;Place;Time;Situation Current Attention Level: Focused Memory: Decreased recall of precautions;Decreased short-term memory Following Commands: Follows one step commands with increased time;Follows multi-step commands  inconsistently Safety/Judgement: Decreased awareness of safety;Decreased awareness of deficits Awareness: Intellectual Problem Solving: Slow processing;Decreased initiation;Difficulty sequencing;Requires verbal cues;Requires tactile cues General Comments: Pt with visual, auditory, and cognitive deficits that make sequencing and multi-task activities difficult. Pt able to complete safely with MAX multimodal cues for safety and sequencing.      General Comments General comments (skin integrity, edema, etc.): pt required max multimodal cues for vision and inattention to R side. Pt states that she has PRN assist upon d/c but unsure on 24/7    Exercises     Assessment/Plan    PT Assessment Patient needs continued PT services  PT Problem List Decreased strength;Decreased range of motion;Decreased activity tolerance;Decreased balance;Decreased mobility;Decreased coordination;Decreased cognition;Decreased knowledge of use of DME;Decreased safety awareness;Decreased knowledge of precautions       PT Treatment Interventions DME instruction;Gait training;Stair training;Functional mobility training;Therapeutic activities;Therapeutic exercise;Balance training;Cognitive remediation;Neuromuscular re-education;Patient/family education    PT Goals (Current goals can be found in the Care Plan section)  Acute Rehab PT Goals Patient Stated Goal: none stated PT Goal Formulation: With patient Time For Goal Achievement: 07/24/20 Potential to Achieve Goals: Fair    Frequency Min 3X/week   Barriers to discharge Decreased caregiver support      Co-evaluation PT/OT/SLP Co-Evaluation/Treatment: Yes Reason for Co-Treatment: Complexity of the patient's impairments (multi-system involvement);Necessary to address cognition/behavior during functional activity PT goals addressed during session: Mobility/safety with mobility;Proper use of DME OT goals addressed during session: ADL's and self-care;Proper use of  Adaptive equipment and DME;Strengthening/ROM       AM-PAC PT "6 Clicks" Mobility  Outcome Measure Help needed turning from your back to your side while in a flat bed without using bedrails?: None Help needed moving from lying on your back  to sitting on the side of a flat bed without using bedrails?: None Help needed moving to and from a bed to a chair (including a wheelchair)?: A Little Help needed standing up from a chair using your arms (e.g., wheelchair or bedside chair)?: A Little Help needed to walk in hospital room?: A Little Help needed climbing 3-5 steps with a railing? : A Lot 6 Click Score: 19    End of Session Equipment Utilized During Treatment: Gait belt Activity Tolerance: Patient tolerated treatment well Patient left: in chair;with call Mariya Mottley/phone within reach;with chair alarm set Nurse Communication: Mobility status PT Visit Diagnosis: Unsteadiness on feet (R26.81);Other abnormalities of gait and mobility (R26.89);Muscle weakness (generalized) (M62.81);Other symptoms and signs involving the nervous system (R29.898);Difficulty in walking, not elsewhere classified (R26.2)    Time: 3005-1102 PT Time Calculation (min) (ACUTE ONLY): 30 min   Charges:   PT Evaluation $PT Eval Moderate Complexity: 1 Mod         Harmon Pier, SPT  Acute Rehabilitation Services  Office: [phone]  07/10/2020, 5:51 PM

## 2020-07-10 NOTE — Procedures (Signed)
Echo attempted. Physicians with patient. Will attempt again later.

## 2020-07-10 NOTE — Evaluation (Signed)
Clinical/Bedside Swallow Evaluation Patient Details  Name: Theresa Landry MRN: [id number] Date of Birth: Jul 15, 1933  Today's Date: 07/10/2020 Time: SLP Start Time (ACUTE ONLY): 0854 SLP Stop Time (ACUTE ONLY): 0904 SLP Time Calculation (min) (ACUTE ONLY): 10 min  Past Medical History:  Past Medical History:  Diagnosis Date  . Arthritis   . Asthma    Past Surgical History:  Past Surgical History:  Procedure Laterality Date  . TONSILLECTOMY     HPI:  Pt is an 86 yo female presenting with AMS and difficulty speaking. MRI showed multiple small areas of acute infarction in the L MCA distribution as well as a small area of hemorrhage in the L parietal cortex. Pt passed the [facility] swallow screen but CXR showed a RLL opacity centered upon several thickened airways, suspicious for PNA or aspiration. PMH includes: asthma, arthritis, HTN, COPD   Assessment / Plan / Recommendation Clinical Impression  Pt has no overt s/s of aspiration across PO trials, with swallow appearing swift and functional. She had mild difficulty following one-step commands for oral motor exam, but with tasks completed, there is no overt or significant impairments noted. Recommend that she begin regular solids and thin liquids. SLP will f/u briefly given findings on CXR. Pt may also benefit from assistance during meals given signs of ideational apraxia during self-feeding.   SLP Visit Diagnosis: Dysphagia, unspecified (R13.10)    Aspiration Risk  Mild aspiration risk    Diet Recommendation Regular;Thin liquid   Liquid Administration via: Cup;Straw Medication Administration: Whole meds with liquid Supervision: Staff to assist with self feeding Compensations: Slow rate Postural Changes: Seated upright at 90 degrees    Other  Recommendations Oral Care Recommendations: Oral care BID   Follow up Recommendations  (tba)      Frequency and Duration min 1 x/week  1 week       Prognosis Prognosis for Safe Diet  Advancement: Good Barriers to Reach Goals: Language deficits      Swallow Study   General HPI: Pt is an 86 yo female presenting with AMS and difficulty speaking. MRI showed multiple small areas of acute infarction in the L MCA distribution as well as a small area of hemorrhage in the L parietal cortex. Pt passed the [facility] swallow screen but CXR showed a RLL opacity centered upon several thickened airways, suspicious for PNA or aspiration. PMH includes: asthma, arthritis, HTN, COPD Type of Study: Bedside Swallow Evaluation Previous Swallow Assessment: none in chart - passed [facility] Diet Prior to this Study: NPO Temperature Spikes Noted: No Respiratory Status: Room air History of Recent Intubation: No Behavior/Cognition: Alert;Cooperative;Pleasant mood;Requires cueing Oral Cavity Assessment: Within Functional Limits Oral Care Completed by SLP: No Oral Cavity - Dentition: Dentures, top Vision: Functional for self-feeding Self-Feeding Abilities: Able to feed self;Needs assist Patient Positioning: Upright in bed Baseline Vocal Quality: Normal    Oral/Motor/Sensory Function Overall Oral Motor/Sensory Function: Within functional limits (assessment mildly impacted by aphasia)   Ice Chips Ice chips: Not tested   Thin Liquid Thin Liquid: Within functional limits Presentation: Cup;Self Fed;Straw    Nectar Thick Nectar Thick Liquid: Not tested   Honey Thick Honey Thick Liquid: Not tested   Puree Puree: Within functional limits Presentation: Self Fed;Spoon   Solid     Solid: Within functional limits Presentation: Self Fed      Mahala Menghini., M.A. CCC-SLP Acute Rehabilitation Services Pager [phone] Office [phone]  07/10/2020,9:40 AM

## 2020-07-10 NOTE — Progress Notes (Signed)
  Echocardiogram 2D Echocardiogram has been performed.  Theresa Landry 07/10/2020, 3:16 PM

## 2020-07-10 NOTE — Evaluation (Signed)
Speech Language Pathology Evaluation Patient Details Name: Theresa Landry MRN: [id number] DOB: March 22, 1933 Today's Date: 07/10/2020 Time: 3151-7616 SLP Time Calculation (min) (ACUTE ONLY): 16 min  Problem List:  Patient Active Problem List   Diagnosis Date Noted  . CVA (cerebral vascular accident) (HCC) 07/09/2020  . Acute asthma exacerbation 05/21/2019  . Adjustment disorder with anxious mood 02/25/2018  . Essential hypertension 02/25/2018  . GERD (gastroesophageal reflux disease) 02/25/2018   Past Medical History:  Past Medical History:  Diagnosis Date  . Arthritis   . Asthma    Past Surgical History:  Past Surgical History:  Procedure Laterality Date  . TONSILLECTOMY     HPI:  Pt is an 86 yo female presenting with AMS and difficulty speaking. MRI showed multiple small areas of acute infarction in the L MCA distribution as well as a small area of hemorrhage in the L parietal cortex. Pt passed the [facility] swallow screen but CXR showed a RLL opacity centered upon several thickened airways, suspicious for PNA or aspiration. PMH includes: asthma, arthritis, HTN, COPD   Assessment / Plan / Recommendation Clinical Impression  Pt has an expressive more than receptive aphasia, also exhibiting decreased awareness of her R environment and reduced sustained attention. She is almost 75% accurate with simple yes/no questions; ~50% accurate with more complex ones. She responds to most open-ended questions with "I don't know." She follows simple one-step commands with Min cues. Expressively she has significant word-finding difficulties in spontaneous communication as well as impaired repetition at the phrase level and confrontational naming. Accuracy with naming increased from ~50% to ~80% once given sentence-completion and phonemic cues from SLP. She will benefit from ongoing SLP services both acutely and at next level of care to maximize functional communication.     SLP Assessment  SLP  Recommendation/Assessment: Patient needs continued Speech Lanaguage Pathology Services SLP Visit Diagnosis: Aphasia (R47.01)    Follow Up Recommendations   (24/7 supervision and SLP f/u at next level of care)    Frequency and Duration min 2x/week  2 weeks      SLP Evaluation Cognition  Overall Cognitive Status: Difficult to assess (aphasia) Arousal/Alertness: Awake/alert Orientation Level: Oriented to person Attention: Sustained Sustained Attention: Impaired Sustained Attention Impairment: Functional basic       Comprehension  Auditory Comprehension Overall Auditory Comprehension: Impaired Yes/No Questions: Impaired Basic Biographical Questions: 76-100% accurate Basic Immediate Environment Questions: 50-74% accurate Complex Questions: 50-74% accurate Commands: Impaired One Step Basic Commands: 50-74% accurate Conversation: Simple Interfering Components: Hearing    Expression Expression Primary Mode of Expression: Verbal Verbal Expression Overall Verbal Expression: Impaired Initiation: No impairment Automatic Speech: Name;Social Response Level of Generative/Spontaneous Verbalization: Phrase Repetition: Impaired Level of Impairment: Phrase level;Sentence level Naming: Impairment Confrontation: Impaired Verbal Errors: Perseveration (intermittent awareness) Non-Verbal Means of Communication: Not applicable   Oral / Motor  Oral Motor/Sensory Function Overall Oral Motor/Sensory Function: Within functional limits (mildly impacted by aphasia) Motor Speech Overall Motor Speech: Appears within functional limits for tasks assessed   GO                    Mahala Menghini., M.A. CCC-SLP Acute Rehabilitation Services Pager [phone] Office [phone]  07/10/2020, 9:51 AM

## 2020-07-10 NOTE — Evaluation (Addendum)
Occupational Therapy Evaluation Patient Details Name: Theresa Landry MRN: [id number] DOB: Oct 05, 1933 Today's Date: 07/10/2020    History of Present Illness Pt is a 86 y.o. female with PMH of bronchiectasis, asthma, GERD, essential HTN, adjustment disorder with anxious mood, and mild cognitive impairment, acquired hypothyroidism, rheumatoid arthritis, and bilateral sensorineural hearing loss presenting with aphasia and right-sided weakness. MRI showed multiple small areas of acute infarct in the left MCA distribution and left caudate body with a small area of acute hemorrhage in the left parietal cortex.   Clinical Impression   PTA pt reports being independent with assistive devices for mobility and needing some assists with IADLs due to cognition. Pt was admitted for above and treated for problem list below (see OT Problem List). Pt is A&Ox2 disoriented to time and situation. Pt with visual (R side inattention), auditory, and cognitive deficits that make sequencing and multi-task activities difficult. Pt able to complete safely with MAX multimodal cues for safety and sequencing. Requires supervision - Min A with ADLs due to impaired balance, impaired cognition, and weakness. Requires  A with transfers and ambulation with min guard - Min A with cues for safety with walker and directions due to impaired vision on R side. Believe pt would benefit from skilled OT services acutely and at the SNF level to increase safety and return to PLOF.    Follow Up Recommendations  SNF;Supervision/Assistance - 24 hour    Equipment Recommendations  Other (comment) (TBD at next venue of care)       Precautions / Restrictions Precautions Precautions: Fall      Mobility Bed Mobility Overal bed mobility: Needs Assistance Bed Mobility: Supine to Sit     Supine to sit: Supervision;HOB elevated     General bed mobility comments: supervision with HOB elevated and verbal cues for scooting to  EOB  Transfers Overall transfer level: Needs assistance Equipment used: Rolling walker (2 wheeled) Transfers: Sit to/from Stand Sit to Stand: Min assist;Min guard         General transfer comment: Min A for boost and min guard for steady in standing    Balance Overall balance assessment: Needs assistance Sitting-balance support: No upper extremity supported;Feet supported Sitting balance-Leahy Scale: Fair Sitting balance - Comments: min guard   Standing balance support: During functional activity;No upper extremity supported Standing balance-Leahy Scale: Fair Standing balance comment: close supervision                           ADL either performed or assessed with clinical judgement   ADL Overall ADL's : Needs assistance/impaired Eating/Feeding: Supervision/ safety;Sitting Eating/Feeding Details (indicate cue type and reason): pt able to use fork in R hand to take a bite of her lunch with supervision Grooming: Wash/dry hands;Min guard;Cueing for safety;Cueing for sequencing;Standing Grooming Details (indicate cue type and reason): Min guard with max verbal cues for seqeuncing and scanning  Upper Body Bathing: Min guard;Sitting;Cueing for safety;Cueing for sequencing   Lower Body Bathing: Sitting/lateral leans;Sit to/from stand;Minimal assistance;Cueing for safety;Cueing for sequencing Lower Body Bathing Details (indicate cue type and reason): Min A with sit<>stand  Upper Body Dressing : Min guard;Sitting;Cueing for safety;Cueing for sequencing   Lower Body Dressing: Minimal assistance;Sit to/from stand;Sitting/lateral leans;Cueing for sequencing;Cueing for safety Lower Body Dressing Details (indicate cue type and reason): Min A with sit<>stand able to complete figure 4 technique with  Toilet Transfer: Ambulation;Regular Toilet;Minimal assistance;Cueing for safety;Cueing for sequencing Toilet Transfer Details (indicate cue type and reason):  simulated to  recliner Toileting- Clothing Manipulation and Hygiene: Sitting/lateral lean;Sit to/from stand;Cueing for safety;Cueing for sequencing;Minimal assistance Toileting - Clothing Manipulation Details (indicate cue type and reason): Min A sit<>stand   Tub/Shower Transfer Details (indicate cue type and reason): deferred due to pt's safety Functional mobility during ADLs: Minimal assistance;Min guard;Rolling walker General ADL Comments: Pt with decreased cognition, vision, and hearing     Vision Baseline Vision/History: Wears glasses Wears Glasses: Reading only Patient Visual Report: Diplopia Vision Assessment?: Yes Eye Alignment: Within Functional Limits Ocular Range of Motion: Within Functional Limits Alignment/Gaze Preference: Within Defined Limits Tracking/Visual Pursuits: Decreased smoothness of horizontal tracking (decreased smoothness on R side) Visual Fields: Right visual field deficit Diplopia Assessment: Present in near gaze;Present in far gaze Additional Comments: Pt reports double vision at baseline but has noted deficits in R visual field. Needs cues to look to R side to locate objects            Pertinent Vitals/Pain Pain Assessment: Faces Faces Pain Scale: No hurt     Hand Dominance Right   Extremity/Trunk Assessment Upper Extremity Assessment Upper Extremity Assessment: Generalized weakness   Lower Extremity Assessment Lower Extremity Assessment: Defer to PT evaluation   Cervical / Trunk Assessment Cervical / Trunk Assessment: Normal   Communication Communication Communication: Expressive difficulties;HOH (trouble word finding)   Cognition Arousal/Alertness: Awake/alert Behavior During Therapy: WFL for tasks assessed/performed Overall Cognitive Status: History of cognitive impairments - at baseline Area of Impairment: Orientation;Attention;Memory;Following commands;Safety/judgement;Awareness;Problem solving                 Orientation Level: Disoriented  to;Place;Time;Situation (needing cues to determine place) Current Attention Level: Focused Memory: Decreased recall of precautions;Decreased short-term memory Following Commands: Follows one step commands with increased time;Follows multi-step commands inconsistently Safety/Judgement: Decreased awareness of safety;Decreased awareness of deficits Awareness: Intellectual Problem Solving: Slow processing;Decreased initiation;Difficulty sequencing;Requires verbal cues;Requires tactile cues General Comments: Pt with visual, auditory, and cognitive deficits that make sequencing and multi-task activities difficult. Pt able to complete safely with MAX multimodal cues for safety and sequencing.   General Comments  Pt reporting she has assist PRN upon dc but unsure of 24/7. Pt needing MAX multimodal cues for vision and attention to R side.             Home Living Family/patient expects to be discharged to:: Private residence Living Arrangements: Alone Available Help at Discharge: Friend(s);Personal care attendant;Available PRN/intermittently;Family Type of Home: House       Home Layout: One level     Bathroom Shower/Tub: Producer, television/film/video: Standard     Home Equipment: Environmental consultant - 2 wheels;Cane - single point   Additional Comments: Pt limited historian and no family to confirm support and home set up      Prior Functioning/Environment Level of Independence: Needs assistance  Gait / Transfers Assistance Needed: Pt reports using a walker and a cane for mobility ADL's / Homemaking Assistance Needed: Pt manages own medicine but son in [place], [place] manages finances and hired someone to help with cleaning due to declining cognition   Comments: Obtained information from pt - limited historian        OT Problem List: Decreased strength;Decreased range of motion;Impaired balance (sitting and/or standing);Impaired vision/perception;Decreased coordination;Decreased  cognition;Decreased knowledge of use of DME or AE;Decreased safety awareness;Decreased knowledge of precautions      OT Treatment/Interventions: Self-care/ADL training;Neuromuscular education;Energy conservation;DME and/or AE instruction;Therapeutic activities;Cognitive remediation/compensation;Visual/perceptual remediation/compensation;Patient/family education;Balance training    OT Goals(Current goals can be  found in the care plan section) Acute Rehab OT Goals Patient Stated Goal: none stated OT Goal Formulation: With patient Time For Goal Achievement: 07/24/20 Potential to Achieve Goals: Good  OT Frequency: Min 2X/week           Co-evaluation PT/OT/SLP Co-Evaluation/Treatment: Yes Reason for Co-Treatment: Complexity of the patient's impairments (multi-system involvement);Necessary to address cognition/behavior during functional activity;For patient/therapist safety;To address functional/ADL transfers   OT goals addressed during session: ADL's and self-care;Proper use of Adaptive equipment and DME;Strengthening/ROM      AM-PAC OT "6 Clicks" Daily Activity     Outcome Measure Help from another person eating meals?: A Little Help from another person taking care of personal grooming?: A Little Help from another person toileting, which includes using toliet, bedpan, or urinal?: A Little Help from another person bathing (including washing, rinsing, drying)?: A Little Help from another person to put on and taking off regular upper body clothing?: A Little Help from another person to put on and taking off regular lower body clothing?: A Little 6 Click Score: 18   End of Session Equipment Utilized During Treatment: Gait belt;Rolling walker Nurse Communication: Mobility status  Activity Tolerance: Patient tolerated treatment well Patient left: in bed;with call bell/phone within reach;with chair alarm set  OT Visit Diagnosis: Unsteadiness on feet (R26.81);Muscle weakness (generalized)  (M62.81);Other symptoms and signs involving the nervous system (R29.898);Other symptoms and signs involving cognitive function;Low vision, both eyes (H54.2)                Time: 1610-9604 OT Time Calculation (min): 29 min Charges:  OT General Charges $OT Visit: 1 Visit OT Evaluation $OT Eval Moderate Complexity: 1 Mod  Silvino Selman/OTS  Enola Siebers 07/10/2020, 3:54 PM

## 2020-07-11 ENCOUNTER — Encounter (HOSPITAL_COMMUNITY)
Admission: EM | Disposition: A | Discharge status: Skilled Nursing Facility | Payer: Self-pay | Source: Home / Self Care | Attending: Student in an Organized Health Care Education/Training Program

## 2020-07-11 DIAGNOSIS — I639 Cerebral infarction, unspecified: Secondary | ICD-10-CM

## 2020-07-11 HISTORY — PX: LOOP RECORDER INSERTION: EP1214

## 2020-07-11 LAB — CBC
HCT: 37.4 % (ref 36.0–46.0)
Hemoglobin: 12.3 g/dL (ref 12.0–15.0)
MCH: 33.2 pg (ref 26.0–34.0)
MCHC: 32.9 g/dL (ref 30.0–36.0)
MCV: 101.1 fL — ABNORMAL HIGH (ref 80.0–100.0)
Platelets: 211 10*3/uL (ref 150–400)
RBC: 3.7 MIL/uL — ABNORMAL LOW (ref 3.87–5.11)
RDW: 14.5 % (ref 11.5–15.5)
WBC: 10.4 10*3/uL (ref 4.0–10.5)
nRBC: 0 % (ref 0.0–0.2)

## 2020-07-11 LAB — BASIC METABOLIC PANEL
Anion gap: 14 (ref 5–15)
BUN: 8 mg/dL (ref 8–23)
CO2: 19 mmol/L — ABNORMAL LOW (ref 22–32)
Calcium: 9.3 mg/dL (ref 8.9–10.3)
Chloride: 104 mmol/L (ref 98–111)
Creatinine, Ser: 0.82 mg/dL (ref 0.44–1.00)
GFR calc Af Amer: >60 mL/min (ref >60)
GFR calc non Af Amer: >60 mL/min (ref >60)
Glucose, Bld: 107 mg/dL — ABNORMAL HIGH (ref 70–99)
Potassium: 4 mmol/L (ref 3.5–5.1)
Sodium: 137 mmol/L (ref 135–145)

## 2020-07-11 SURGERY — LOOP RECORDER INSERTION

## 2020-07-11 MED ORDER — LIDOCAINE-EPINEPHRINE 1 %-1:100000 IJ SOLN
INTRAMUSCULAR | Status: DC | PRN
  Administered 2020-07-11: 15 mL

## 2020-07-11 MED ORDER — LIDOCAINE-EPINEPHRINE 1 %-1:100000 IJ SOLN
INTRAMUSCULAR | Status: AC
  Filled 2020-07-11: qty 1

## 2020-07-11 SURGICAL SUPPLY — 2 items
MONITOR REVEAL LINQ II (Prosthesis & Implant Heart) ×3 IMPLANT
PACK LOOP INSERTION (CUSTOM PROCEDURE TRAY) ×3 IMPLANT

## 2020-07-11 NOTE — Discharge Instructions (Signed)
Wound care instructions (heart monitor implant) Keep incision clean and dry for 3 days. You can remove outer dressing tomorrow. Leave steri-strips (little pieces of tape) on until seen in the office for wound check appointment. Call the office ([phone]) for redness, drainage, swelling, or fever.  

## 2020-07-11 NOTE — Progress Notes (Addendum)
Subjective: Day 2 for Theresa Landry, 86 y.o. female living with HTN & HLD here with an acute left MCA stroke suspecting embolic etiology likely 2/2 an AF.     Interviewed at bedside. Patient was cooperative and very pleasant around team, but still was confused today. States that the cough she has is from her chronic COPD and that it has not changed during her stay. Otherwise, patient had no new complaints.  Objective:  Vital signs in last 24 hours: Vitals:   07/11/20 0322 07/11/20 0600 07/11/20 0923 07/11/20 1114  BP: 106/79  (!) 134/98 119/71  Pulse: (!) 49  67   Resp: 20  18   Temp: 98.2 F (36.8 C)  98 F (36.7 C)   TempSrc: Oral  Oral   SpO2: 91%  97%   Weight:  64.3 kg    Height:       Weight change: 1.8 kg  Intake/Output Summary (Last 24 hours) at 07/11/2020 1140 Last data filed at 07/10/2020 1800 Gross per 24 hour  Intake 250 ml  Output --  Net 250 ml   Physical Exam: GEN: NAD, lying in bed ENT: MMM, no white-gray exudate was present CV: RRR, no murmurs, rubs, or gallops PULM: CTA B, no wheezing, rhonchi or crackles EXT: No edema NEURO:  Cranial nerves V-XII intact. Right-sided hemi-neglect.  Motor: Normal muscle bulk. Strength of extremities were intact.  Language: Mild expressive aphasia  PSYCH: Cooperative but confused and disoriented   Assessment/Plan: This is hospital day 2 for Theresa Landry, 86 y.o. female living with HTN & HLD here with an acute left MCA stroke suspecting embolic etiology likely 2/2 an AF.    Principal Problem:   CVA (cerebral vascular accident) [facility]) Active Problems:   Essential hypertension   Cerebral hemorrhage (HCC)   Aphasia  Acute left MCA stroke. Patient currently has expressive aphasia without dysarthria and right hemineglect.  After consultation with stroke team, they suspect this to be of embolic etiology due to undiagnosed atrial fibrillation. Echo came back unremarkable and showed LV EF of ~60-65%. Loop recorder was  recommended by stroke team to monitor for paroxysmal a.fib, greatly appreciate their assistance in this. Patient and her son have agreed to loop recorder procedure. Therapy with SLP appears to be going well, grateful for their continued care. PT&OT both recommended temporary SNF placement following discharge and TOC team is coordinating that, appreciate all of their assistance. Will continue DAPT + statin regimen while teams work up suspected embolic origin.   Plan:  --Awaiting loop recorder placement procedure --Continue BMP and CBC --Frequent neuro checks  --DAPT (Plavix 75 mg & ASA 81 mg) for three weeks, followed by aspirin alone indefinitely --Atorvastatin 80 mg    Essential HTN. Intermittent HTN throughout hospital course. Cardizem held due to concern of bradycardia. Placed on hydralazine injection 5 mg q4h PRN if BP>150 to prevent further hemorrhage. Added amlodipine 5 mg daily. Most recent BP was 148/72.  Plan:  --Holding Cardizem due to intermittent bradycardia --Continue Amlodipine 5 mg daily --Continue Hydralazine injection 5 mg q4h PRN if BP>150  HLD. Found to have an elevated LDL (105) on lipid panel, goal is <70. Started on lipitor (atorvastatin) 80 mg and will continue going forward.  Plan:  --Continue atorvastatin 80 mg    COPD. Appears to be well controlled, currently in no acute distress.  Plan:  --Continue Albuterol nebulizers q6h PRN for wheezing/SOB --Continue Dulera 2puffs BID    First Degree AV Block. Found to have  a first degree AV block, appears to be a chronic issue give that ECG from 05/21/2019 demonstrated similar results. Plan:  --Continue telemetry monitoring   LOS: 2 days   Theresa Landry, Medical Student 07/11/2020, 11:40 AM

## 2020-07-11 NOTE — TOC Initial Note (Signed)
Transition of Care [facility]) - Initial/Assessment Note    Patient Details  Name: Theresa Landry MRN: [id number] Date of Birth: October 17, 1933  Transition of Care [facility]) CM/SW Contact:    Baldemar Lenis, LCSW Phone Number: 07/11/2020, 11:28 AM  Clinical Narrative:   CSW spoke with patient's son/HCPOA Molli Hazard over the phone to discuss recommendation for SNF. Molli Hazard in agreement, but would like to defer to his aunt, Jamesetta So, for selection of SNF as she is here in [place] and he is in [place]. CSW sent out referral, will follow up with [facility] for bed offers and choice. Patient has been fully vaccinated.                 Expected Discharge Plan: Skilled Nursing Facility Barriers to Discharge: Insurance Authorization, Continued Medical Work up, Requiring sitter/restraints   Patient Goals and CMS Choice Patient states their goals for this hospitalization and ongoing recovery are:: patient unable to participate in goal setting due to disorientation CMS Medicare.gov Compare Post Acute Care list provided to:: Patient Represenative (must comment) Choice offered to / list presented to : [facility] POA / Guardian  Expected Discharge Plan and Services Expected Discharge Plan: Skilled Nursing Facility     Post Acute Care Choice: Skilled Nursing Facility Living arrangements for the past 2 months: Single Family Home                                      Prior Living Arrangements/Services Living arrangements for the past 2 months: Single Family Home Lives with:: Self Patient language and need for interpreter reviewed:: No Do you feel safe going back to the place where you live?: Yes      Need for Family Participation in Patient Care: Yes (Comment) Care giver support system in place?: No (comment)   Criminal Activity/Legal Involvement Pertinent to Current Situation/Hospitalization: No - Comment as needed  Activities of Daily Living Home Assistive Devices/Equipment: None ADL Screening  (condition at time of admission) Patient's cognitive ability adequate to safely complete daily activities?: Yes Is the patient deaf or have difficulty hearing?: No Does the patient have difficulty seeing, even when wearing glasses/contacts?: No Does the patient have difficulty concentrating, remembering, or making decisions?: No Patient able to express need for assistance with ADLs?: Yes Does the patient have difficulty dressing or bathing?: No Independently performs ADLs?: Yes (appropriate for developmental age) Does the patient have difficulty walking or climbing stairs?: No Weakness of Legs: None Weakness of Arms/Hands: None  Permission Sought/Granted Permission sought to share information with : Facility Medical sales representative, Family Supports Permission granted to share information with : Yes, Verbal Permission Granted  Share Information with NAME: Everlean Cherry  Permission granted to share info w AGENCY: SNF  Permission granted to share info w Relationship: Son/HCPOA, Sister     Emotional Assessment   Attitude/Demeanor/Rapport: Unable to Assess Affect (typically observed): Unable to Assess Orientation: : Oriented to Self      Admission diagnosis:  Aphasia [R47.01] Confusion [R41.0] CVA (cerebral vascular accident) [facility]) [I63.9] Patient Active Problem List   Diagnosis Date Noted  . Cerebral hemorrhage (HCC) 07/10/2020  . Aphasia   . CVA (cerebral vascular accident) (HCC) 07/09/2020  . Acute asthma exacerbation 05/21/2019  . Adjustment disorder with anxious mood 02/25/2018  . Essential hypertension 02/25/2018  . GERD (gastroesophageal reflux disease) 02/25/2018   PCP:  Loyal Jacobson, MD Pharmacy:   [facility] [phone] -  [place], [place] - [address] AT [facility] [address] [facility] [address] [place] [address] Phone: [phone] Fax: [phone]     Social Determinants of Health (SDOH) Interventions    Readmission Risk  Interventions No flowsheet data found.

## 2020-07-11 NOTE — Progress Notes (Signed)
Home medications delivered to pharmacy; slip placed in hard chart.

## 2020-07-11 NOTE — NC FL2 (Signed)
[place] MEDICAID FL2 LEVEL OF CARE SCREENING TOOL     IDENTIFICATION  Patient Name: Theresa Landry Birthdate: [birth year] Sex: female Admission Date (Current Location): 07/09/2020  [facility] and [place] Number:  Producer, television/film/video and Address:  [facility] [place], [address], [place], [place] [address]      Provider Number: [id number]  Attending Physician Name and Address:  Tyson Alias, *  Relative Name and Phone Number:       Current Level of Care: Hospital Recommended Level of Care: Skilled Nursing Facility Prior Approval Number:    Date Approved/Denied:   PASRR Number: [id number]  Discharge Plan: SNF    Current Diagnoses: Patient Active Problem List   Diagnosis Date Noted  . Cerebral hemorrhage (HCC) 07/10/2020  . Aphasia   . CVA (cerebral vascular accident) (HCC) 07/09/2020  . Acute asthma exacerbation 05/21/2019  . Adjustment disorder with anxious mood 02/25/2018  . Essential hypertension 02/25/2018  . GERD (gastroesophageal reflux disease) 02/25/2018    Orientation RESPIRATION BLADDER Height & Weight     Self  Normal Incontinent Weight: 141 lb 12.1 oz (64.3 kg) Height:  5' (152.4 cm)  BEHAVIORAL SYMPTOMS/MOOD NEUROLOGICAL BOWEL NUTRITION STATUS      Incontinent Diet (heart healthy)  AMBULATORY STATUS COMMUNICATION OF NEEDS Skin   Limited Assist Verbally Normal                       Personal Care Assistance Level of Assistance  Bathing, Feeding, Dressing Bathing Assistance: Limited assistance Feeding assistance: Limited assistance Dressing Assistance: Limited assistance     Functional Limitations Info  Speech     Speech Info: Impaired (delayed responses)    SPECIAL CARE FACTORS FREQUENCY  PT (By licensed PT), OT (By licensed OT), Speech therapy     PT Frequency: 5x/wk OT Frequency: 5x/wk     Speech Therapy Frequency: 5x/wk      Contractures Contractures Info: Not present    Additional Factors  Info  Code Status, Allergies Code Status Info: Full Allergies Info: NKA           Current Medications (07/11/2020):  This is the current hospital active medication list Current Facility-Administered Medications  Medication Dose Route Frequency Provider Last Rate Last Admin  . acetaminophen (TYLENOL) tablet 650 mg  650 mg Oral Q6H PRN Seawell, Jaimie A, DO       Or  . acetaminophen (TYLENOL) suppository 650 mg  650 mg Rectal Q6H PRN Seawell, Jaimie A, DO      . albuterol (PROVENTIL) (2.5 MG/3ML) 0.083% nebulizer solution 3 mL  3 mL Inhalation Q6H PRN Seawell, Jaimie A, DO      . amLODipine (NORVASC) tablet 5 mg  5 mg Oral Daily Theotis Barrio, MD   5 mg at 07/11/20 1114  . aspirin EC tablet 81 mg  81 mg Oral Daily Layne Benton, NP   81 mg at 07/11/20 0843  . atorvastatin (LIPITOR) tablet 80 mg  80 mg Oral Daily Theotis Barrio, MD   80 mg at 07/11/20 0841  . clopidogrel (PLAVIX) tablet 75 mg  75 mg Oral Daily Annie Main L, NP   75 mg at 07/11/20 0841  . levothyroxine (SYNTHROID) tablet 25 mcg  25 mcg Oral q morning - 10a Seawell, Jaimie A, DO   25 mcg at 07/11/20 0840  . meclizine (ANTIVERT) tablet 25 mg  25 mg Oral TID PRN Seawell, Jaimie A, DO      .  mometasone-formoterol (DULERA) 200-5 MCG/ACT inhaler 2 puff  2 puff Inhalation BID Seawell, Jaimie A, DO   2 puff at 07/10/20 2025  . ondansetron (ZOFRAN) tablet 4 mg  4 mg Oral Q6H PRN Seawell, Jaimie A, DO       Or  . ondansetron (ZOFRAN) injection 4 mg  4 mg Intravenous Q6H PRN Seawell, Jaimie A, DO      . pantoprazole (PROTONIX) EC tablet 20 mg  20 mg Oral Daily Seawell, Jaimie A, DO   20 mg at 07/11/20 0842  . senna-docusate (Senokot-S) tablet 1 tablet  1 tablet Oral QHS PRN Seawell, Jaimie A, DO         Discharge Medications: Please see discharge summary for a list of discharge medications.  Relevant Imaging Results:  Relevant Lab Results:   Additional Information SS#: [id number]  Baldemar Lenis, LCSW

## 2020-07-11 NOTE — Consult Note (Addendum)
ELECTROPHYSIOLOGY CONSULT NOTE  Patient ID: Theresa Landry MRN: [id number], DOB/AGE: 02-14-1933   Admit date: 07/09/2020 Date of Consult: 07/11/2020  Primary Physician: Loyal Jacobson, MD Primary Cardiologist: none Reason for Consultation: Cryptogenic stroke ; recommendations regarding Implantable Loop Recorder, requested by Dr. Pearlean Brownie  History of Present Illness Theresa Landry was admitted on 07/09/2020 with stroke.  They first developed symptoms while at home, timing unclear    PMHx includes: HTN, bronchiectasis, GERD, adjustment disorder, anxiety, RA, hypothyroidism, some degree of cognitive impairment (son is power of attorney/health care proxy), hard of hearing   Neurology notes: Multiple small L MCA infarcts (mild petechial hemorrhage) embolic secondary to unknown source, suspicious for AF (Multiple small L MCA infarct.small L parietal cortex petechial hemorrhage).  she has undergone workup for stroke including echocardiogram and carotid angio.  The patient has been monitored on telemetry which has demonstrated sinus rhythm with no arrhythmias.    Neurology has deferred TEE.   Echocardiogram this admission demonstrated  IMPRESSIONS  1. Left ventricular ejection fraction, by estimation, is 60 to 65%. The  left ventricle has normal function. The left ventricle has no regional  wall motion abnormalities. There is mild left ventricular hypertrophy of  the basal-septal segment. Left  ventricular diastolic parameters were normal.   2. Right ventricular systolic function is normal. The right ventricular  size is normal.   3. The mitral valve is normal in structure. No evidence of mitral valve  regurgitation. No evidence of mitral stenosis.   4. The aortic valve is normal in structure. Aortic valve regurgitation is  not visualized. No aortic stenosis is present.   5. The inferior vena cava is normal in size with greater than 50%  respiratory variability, suggesting right atrial  pressure of 3 mmHg.    Lab work is reviewed.   Prior to admission, the patient denies chest pain, shortness of breath, dizziness, palpitations, or syncope.  They are recovering from their stroke with plans to SNF at discharge.    Past Medical History:  Diagnosis Date   Arthritis    Asthma      Surgical History:  Past Surgical History:  Procedure Laterality Date   TONSILLECTOMY       Medications Prior to Admission  Medication Sig Dispense Refill Last Dose   albuterol (VENTOLIN HFA) 108 (90 Base) MCG/ACT inhaler Inhale 2 puffs into the lungs every 6 (six) hours as needed.      diltiazem (CARDIZEM CD) 240 MG 24 hr capsule Take 240 mg by mouth daily.      donepezil (ARICEPT) 10 MG tablet Take 10 mg by mouth daily.      escitalopram (LEXAPRO) 20 MG tablet Take 20 mg by mouth daily.      fluticasone (FLONASE) 50 MCG/ACT nasal spray Place 2 sprays into both nostrils daily.      levothyroxine (SYNTHROID) 25 MCG tablet Take 25 mcg by mouth every morning.      meclizine (ANTIVERT) 25 MG tablet Take 1 tablet (25 mg total) by mouth 3 (three) times daily as needed for dizziness. 30 tablet 0    montelukast (SINGULAIR) 10 MG tablet Take 10 mg by mouth daily.      pantoprazole (PROTONIX) 20 MG tablet Take 20 mg by mouth daily.      SYMBICORT 160-4.5 MCG/ACT inhaler Inhale 2 puffs into the lungs 2 (two) times daily.       Inpatient Medications:   amLODipine  5 mg Oral Daily   aspirin EC  81  mg Oral Daily   atorvastatin  80 mg Oral Daily   clopidogrel  75 mg Oral Daily   levothyroxine  25 mcg Oral q morning - 10a   mometasone-formoterol  2 puff Inhalation BID   pantoprazole  20 mg Oral Daily    Allergies: No Known Allergies  Social History   Socioeconomic History   Marital status: Widowed    Spouse name: Not on file   Number of children: Not on file   Years of education: Not on file   Highest education level: Not on file  Occupational History   Not on file  Tobacco Use    Smoking status: Former Smoker   Smokeless tobacco: Never Used  Substance and Sexual Activity   Alcohol use: Yes   Drug use: Never   Sexual activity: Not on file  Other Topics Concern   Not on file  Social History Narrative   Not on file   Social Determinants of Health   Financial Resource Strain:    Difficulty of Paying Living Expenses:   Food Insecurity:    Worried About Programme researcher, broadcasting/film/video in the Last Year:    Barista in the Last Year:   Transportation Needs:    Freight forwarder (Medical):    Lack of Transportation (Non-Medical):   Physical Activity:    Days of Exercise per Week:    Minutes of Exercise per Session:   Stress:    Feeling of Stress :   Social Connections:    Frequency of Communication with Friends and Family:    Frequency of Social Gatherings with Friends and Family:    Attends Religious Services:    Active Member of Clubs or Organizations:    Attends Engineer, structural:    Marital Status:   Intimate Partner Violence:    Fear of Current or Ex-Partner:    Emotionally Abused:    Physically Abused:    Sexually Abused:      Family History  Problem Relation Age of Onset   Heart disease Mother    Stroke Father       Review of Systems: All other systems reviewed and are otherwise negative except as noted above.  Physical Exam: Vitals:   07/10/20 2340 07/11/20 0322 07/11/20 0600 07/11/20 0923  BP: (!) 141/82 106/79  (!) 134/98  Pulse: (!) 58 (!) 49  67  Resp: 19 20  18   Temp: 98.2 F (36.8 C) 98.2 F (36.8 C)  98 F (36.7 C)  TempSrc: Oral Oral  Oral  SpO2: 94% 91%  97%  Weight:   64.3 kg   Height:        GEN- The patient is well appearing, alert and oriented x 3 today.   Head- normocephalic, atraumatic Eyes-  Sclera clear, conjunctiva pink Ears- hearing intact Oropharynx- clear Neck- supple Lungs- CTA b/l, normal work of breathing Heart- RRR, no murmurs, rubs or gallops  GI- soft, NT, ND Extremities- no  clubbing, cyanosis, or edema MS- no significant deformity, age appropriate  atrophy Skin- no rash or lesion Psych- euthymic mood, full affect   Labs:   Lab Results  Component Value Date   WBC 10.4 07/11/2020   HGB 12.3 07/11/2020   HCT 37.4 07/11/2020   MCV 101.1 (H) 07/11/2020   PLT 211 07/11/2020    Recent Labs  Lab 07/09/20 1636 07/09/20 1641 07/11/20 0422  NA 136   < > 137  K 3.8   < > 4.0  CL 103   < > 104  CO2 23   < > 19*  BUN 11   < > 8  CREATININE 0.72   < > 0.82  CALCIUM 9.5   < > 9.3  PROT 7.4  --   --   BILITOT 1.1  --   --   ALKPHOS 57  --   --   ALT 12  --   --   AST 18  --   --   GLUCOSE 103*   < > 107*   < > = values in this interval not displayed.   Lab Results  Component Value Date   TROPONINI <0.03 05/21/2019   Lab Results  Component Value Date   CHOL 186 07/10/2020   Lab Results  Component Value Date   HDL 69 07/10/2020   Lab Results  Component Value Date   LDLCALC 105 (H) 07/10/2020   Lab Results  Component Value Date   TRIG 61 07/10/2020   Lab Results  Component Value Date   CHOLHDL 2.7 07/10/2020   No results found for: LDLDIRECT  No results found for: DDIMER   Radiology/Studies:   CT Code Stroke CTA Head W/WO contrast Result Date: 07/09/2020 CLINICAL DATA:  Code stroke.  Right-sided weakness EXAM: CT HEAD CODE STROKE WITHOUT CT ANGIOGRAPHY HEAD AND NECK CT PERFUSION BRAIN TECHNIQUE: Multidetector CT imaging of the head and neck was performed using the standard protocol during bolus administration of intravenous contrast. Multiplanar CT image reconstructions and MIPs were obtained to evaluate the vascular anatomy. Carotid stenosis measurements (when applicable) are obtained utilizing NASCET criteria, using the distal internal carotid diameter as the denominator. Multiphase CT imaging of the brain was performed following IV bolus contrast injection. Subsequent parametric perfusion maps were calculated using RAPID software.  CONTRAST:  100 mL Omnipaque 350 COMPARISON:  None. FINDINGS: CT HEAD FINDINGS Brain: There is no acute intracranial hemorrhage, mass effect, or edema. Gray-white differentiation is preserved. Ventricles and sulci are prominent reflecting generalized parenchymal volume loss. Patchy hypoattenuation in the supratentorial white matter is nonspecific but probably reflects chronic microvascular ischemic changes. Vascular: There is no hyperdense vessel. Skull: Unremarkable. Sinuses/Orbits: No acute abnormality. Other: None. ASPECTS (Alberta Stroke Program Early CT Score) - Ganglionic level infarction (caudate, lentiform nuclei, internal capsule, insula, M1-M3 cortex): 7 - Supraganglionic infarction (M4-M6 cortex): 3 Total score (0-10 with 10 being normal): 10 Review of the MIP images confirms the above findings CTA NECK FINDINGS Aortic arch: Calcified plaque is present along the arch and at the patent great vessel origins. Right carotid system: Patent. There is calcified plaque at the ICA origin causing less than 50% stenosis. Left carotid system: Patent. There is calcified plaque at the ICA origin causing less than 50% stenosis. Vertebral arteries: Patent.  Left vertebral artery is dominant. Skeleton: Multilevel degenerative changes of the cervical spine. Other neck: No mass or adenopathy. Upper chest: No apical lung mass. Review of the MIP images confirms the above findings CTA HEAD FINDINGS Anterior circulation: Intracranial internal carotid arteries are patent with calcified plaque causing mild stenosis. Right middle and both anterior cerebral arteries are patent. Left M1 MCA is patent. Proximal left M2 branches are patent. There is diminished opacification in a vessel adjacent to a left M2 branch within the sylvian fissure, which is favored to be venous particularly given perfusion findings. Posterior circulation: Intracranial vertebral arteries are patent with mild calcified plaque on the right. PICA origins are  patent. Basilar artery is patent. Superior cerebellar origins  are patent. Posterior cerebral arteries are patent. Venous sinuses: Not well evaluated Review of the MIP images confirms the above findings CT Brain Perfusion Findings: CBF (<30%) Volume: 42mL Perfusion (Tmax>6.0s) volume: 79mL Mismatch Volume: 1mL Infarction Location:None IMPRESSION: No acute intracranial hemorrhage or evidence of acute infarction. ASPECT score is 10. Perfusion imaging demonstrates no evidence of core infarction or penumbra. No large vessel occlusion. Diminished opacification of a vessel visually contiguous with a left M2 MCA within the sylvian fissure is favored to be venous particularly given perfusion findings Calcified plaque at the ICA origins causing less than 50% stenosis. These results were communicated to Dr. Pearlean Brownie at 5:00 pmon 8/2/2021by text page via the [facility] messaging system. Electronically Signed   By: Guadlupe Spanish M.D.   On: 07/09/2020 17:05      MR BRAIN WO CONTRAST Result Date: 07/09/2020 CLINICAL DATA:  Stroke follow-up. EXAM: MRI HEAD WITHOUT CONTRAST TECHNIQUE: Multiplanar, multiecho pulse sequences of the brain and surrounding structures were obtained without intravenous contrast. COMPARISON:  CT angio head and neck 07/09/2020 FINDINGS: Brain: Multiple small areas of chronic infarct in the left MCA territory. This involves the left frontal lobe and portions the left parietal lobe. There is acute infarct in the left caudate body. Small area of hemorrhage in the left parietal cortex in the area of acute infarct. This is likely acute hemorrhage however is not hyperdense on CT. Vascular: Normal arterial flow voids Skull and upper cervical spine: Negative Sinuses/Orbits: Mucosal edema paranasal sinuses, moderate mucosal edema paranasal sinuses without air-fluid level. No orbital lesion.  Bilateral cataract extraction Other: None IMPRESSION: Multiple small areas of acute infarct in the left MCA distribution as above.  Small area of hemorrhage in the left parietal cortex which may be recent however is not hyperdense on CT. This is likely due to the small volume of blood. These results were called by telephone at the time of interpretation on 07/09/2020 at 8:45 pm to provider Aroor , who verbally acknowledged these results. Electronically Signed   By: Marlan Palau M.D.   On: 07/09/2020 20:46    EEG adult Result Date: 07/10/2020 Charlsie Quest, MD     07/10/2020  9:50 AM Patient Name: Goretti Dux MRN: [id number] Epilepsy Attending: Charlsie Quest Referring Physician/Provider: Dr. Delia Heady Date: 07/09/2020 Duration: 24.55 minutes Patient history: 4 old female who presented with altered mental status and speech disturbance.  EEG to assess for seizures. Level of alertness: Awake, asleep AEDs during EEG study: None Technical aspects: This EEG study was done with scalp electrodes positioned according to the 10-20 International system of electrode placement. Electrical activity was acquired at a sampling rate of 500Hz  and reviewed with a high frequency filter of 70Hz  and a low frequency filter of 1Hz . EEG data were recorded continuously and digitally stored. Description: During awake state, no clear posterior dominant rhythm was seen.  Sleep was characterized by vertex waves, sleep spindles (12 to 14 Hz), maximal frontocentral region.  EEG showed continuous generalized 3 to 5 Hz theta-delta slowing.  Hyperventilation and photic stimulation were not performed.   ABNORMALITY -Continuous slow, generalized IMPRESSION: This study is suggestive of moderate diffuse encephalopathy, nonspecific to etiology.  No seizures or epileptiform discharges were seen throughout the recording. Priyanka O Theresa Landry      12-lead ECG SR, 1st degree AVblock All prior EKG's in EPIC reviewed with no documented atrial fibrillation  Telemetry SR, SB  Assessment and Plan:  1. Cryptogenic stroke The patient presents with cryptogenic stroke.  I  spoke at length with the patient about monitoring for afib with either a 30 day event monitor or an implantable loop recorder.  Risks, benefits, and alteratives to implantable loop recorder were discussed with the patient today.   At this time, the patient is very clear in her decision to proceed with implantable loop recorder.   Her son Molli Hazard carries her POA and heath care proxy,  I have called but got his VM.   Once I am able to speak with him, if he is also agreeable, Caro Brundidge plan for loop implant if he is also in agreement. She is pending SNF placement, Nicol Herbig do today in effort not to delay her discharge once she has bed availability  Wound care was reviewed with the patient (keep incision clean and dry for 3 days).  Wound check Naureen Benton be  scheduled for the patient.  ADDEND I have spoken to [facility], he is also agreeable to proceed with loop implant  Please call with questions.   Sheilah Pigeon, PA-C 07/11/2020  I have seen and examined this patient with Francis Dowse.  Agree with above, note added to reflect my findings.  On exam, RRR, no murmurs, lungs clear.  Patient presented to the hospital with cryptogenic stroke. To date, no cause has been found. TEE planned for today. If unrevealing, Lizzie An plan for LINQ monitor to look for atrial fibrillation. Risks and benefits discussed. Risks include but not limited to bleeding and infection. The patient understands the risks and has agreed to the procedure.  Renita Brocks M. Aashna Matson MD 07/11/2020 1:36 PM

## 2020-07-11 NOTE — Progress Notes (Signed)
  Speech Language Pathology Treatment: Dysphagia;Cognitive-Linquistic  Patient Details Name: Theresa Landry MRN: [id number] DOB: 21-Oct-1933 Today's Date: 07/11/2020 Time: 6295-2841 SLP Time Calculation (min) (ACUTE ONLY): 15 min  Assessment / Plan / Recommendation Clinical Impression  Pt was seen near the end of her breakfast meal, agreeable to only small amounts of intake as she was getting full. She tends to put more food in her mouth before she swallows what is already there, resulting in a larger amount of food in her mouth at any time. SLP provided Min cues to initiate posterior transit and use a liquid wash to clear small amounts of residue that remained. No overt s/s of aspiration noted. The remainder of the session focused on language goals, with accuracy improved during confrontational naming tasks (90%). She completed divergent naming task, only able to produce four words in a given category but able to double this when given Mod cues. Min cues were also given for problem solving for use of call bell, but pt was also able to follow one-step commands well throughout session. Will continue to follow.    HPI HPI: Pt is an 86 yo female presenting with AMS and difficulty speaking. MRI showed multiple small areas of acute infarction in the L MCA distribution as well as a small area of hemorrhage in the L parietal cortex. Pt passed the Yale swallow screen but CXR showed a RLL opacity centered upon several thickened airways, suspicious for PNA or aspiration. PMH includes: asthma, arthritis, HTN, COPD      SLP Plan  Continue with current plan of care       Recommendations  Diet recommendations: Regular;Thin liquid Liquids provided via: Cup;Straw Medication Administration: Whole meds with liquid Supervision: Patient able to self feed (set-up assistance) Compensations: Slow rate;Small sips/bites Postural Changes and/or Swallow Maneuvers: Seated upright 90 degrees                Oral  Care Recommendations: Oral care BID Follow up Recommendations: Skilled Nursing facility SLP Visit Diagnosis: Aphasia (R47.01);Dysphagia, unspecified (R13.10) Plan: Continue with current plan of care       GO                Mahala Menghini., M.A. CCC-SLP Acute Rehabilitation Services Pager [phone] Office [phone]  07/11/2020, 9:18 AM

## 2020-07-11 NOTE — Progress Notes (Signed)
STROKE TEAM PROGRESS NOTE   INTERVAL HISTORY Patient up in chair, she is pleasant and interactive but has mild confusion and disorientation.  Echocardiogram is unremarkable.  Loop recorder is pending. Vitals:   07/11/20 0600 07/11/20 0923 07/11/20 1114 07/11/20 1145  BP:  (!) 134/98 119/71 (!) 148/72  Pulse:  67  63  Resp:  18  20  Temp:  98 F (36.7 C)  98.1 F (36.7 C)  TempSrc:  Oral  Oral  SpO2:  97%  96%  Weight: 64.3 kg     Height:       CBC:  Recent Labs  Lab 07/09/20 1636 07/09/20 1641 07/10/20 0235 07/11/20 0422  WBC 10.2   < > 11.1* 10.4  NEUTROABS 4.4  --   --   --   HGB 11.9*   < > 11.5* 12.3  HCT 36.7   < > 35.2* 37.4  MCV 101.9*   < > 100.3* 101.1*  PLT 225   < > 222 211   < > = values in this interval not displayed.   Basic Metabolic Panel:  Recent Labs  Lab 07/10/20 0235 07/11/20 0422  NA 134* 137  K 3.4* 4.0  CL 101 104  CO2 23 19*  GLUCOSE 123* 107*  BUN 8 8  CREATININE 0.63 0.82  CALCIUM 9.3 9.3  MG 1.9  --    Lipid Panel:  Recent Labs  Lab 07/10/20 0235  CHOL 186  TRIG 61  HDL 69  CHOLHDL 2.7  VLDL 12  LDLCALC 858*   HgbA1c:  Recent Labs  Lab 07/10/20 0235  HGBA1C 5.6   Urine Drug Screen: No results for input(s): LABOPIA, COCAINSCRNUR, LABBENZ, AMPHETMU, THCU, LABBARB in the last 168 hours.  Alcohol Level No results for input(s): ETH in the last 168 hours.  IMAGING past 24 hours ECHOCARDIOGRAM COMPLETE  Result Date: 07/10/2020    ECHOCARDIOGRAM REPORT   Patient Name:   Theresa Landry Date of Exam: 07/10/2020 Medical Rec #:  [id number]         Height:       60.0 in Accession #:    [id number]        Weight:       137.8 lb Date of Birth:  14-Feb-1933         BSA:          1.593 m Patient Age:    87 years          BP:           172/61 mmHg Patient Gender: F                 HR:           53 bpm. Exam Location:  Inpatient Procedure: 2D Echo, Cardiac Doppler and Color Doppler Indications:    Stoke  History:        Patient has no prior  history of Echocardiogram examinations.                 Risk Factors:Hypertension and Former Smoker. GERD.  Sonographer:    Ross Ludwig RDCS (AE) Referring Phys: [id number] [facility]  Sonographer Comments: Suboptimal parasternal window. IMPRESSIONS  1. Left ventricular ejection fraction, by estimation, is 60 to 65%. The left ventricle has normal function. The left ventricle has no regional wall motion abnormalities. There is mild left ventricular hypertrophy of the basal-septal segment. Left ventricular diastolic parameters were normal.  2. Right ventricular systolic function is normal. The  right ventricular size is normal.  3. The mitral valve is normal in structure. No evidence of mitral valve regurgitation. No evidence of mitral stenosis.  4. The aortic valve is normal in structure. Aortic valve regurgitation is not visualized. No aortic stenosis is present.  5. The inferior vena cava is normal in size with greater than 50% respiratory variability, suggesting right atrial pressure of 3 mmHg. FINDINGS  Left Ventricle: Left ventricular ejection fraction, by estimation, is 60 to 65%. The left ventricle has normal function. The left ventricle has no regional wall motion abnormalities. The left ventricular internal cavity size was normal in size. There is  mild left ventricular hypertrophy of the basal-septal segment. Left ventricular diastolic parameters were normal. Indeterminate filling pressures. Right Ventricle: The right ventricular size is normal. No increase in right ventricular wall thickness. Right ventricular systolic function is normal. Left Atrium: Left atrial size was normal in size. Right Atrium: Right atrial size was normal in size. Pericardium: There is no evidence of pericardial effusion. Mitral Valve: The mitral valve is normal in structure. Normal mobility of the mitral valve leaflets. No evidence of mitral valve regurgitation. No evidence of mitral valve stenosis. Tricuspid Valve: The  tricuspid valve is normal in structure. Tricuspid valve regurgitation is not demonstrated. No evidence of tricuspid stenosis. Aortic Valve: The aortic valve is normal in structure. Aortic valve regurgitation is not visualized. No aortic stenosis is present. Aortic valve mean gradient measures 5.0 mmHg. Aortic valve peak gradient measures 9.6 mmHg. Aortic valve area, by VTI measures 1.90 cm. Pulmonic Valve: The pulmonic valve was normal in structure. Pulmonic valve regurgitation is not visualized. No evidence of pulmonic stenosis. Aorta: The aortic root is normal in size and structure. Venous: The inferior vena cava is normal in size with greater than 50% respiratory variability, suggesting right atrial pressure of 3 mmHg. IAS/Shunts: No atrial level shunt detected by color flow Doppler.  LEFT VENTRICLE PLAX 2D LVIDd:         3.90 cm  Diastology LVIDs:         2.70 cm  LV e' lateral:   6.09 cm/s LV PW:         0.90 cm  LV E/e' lateral: 14.1 LV IVS:        1.27 cm  LV e' medial:    6.96 cm/s LVOT diam:     1.84 cm  LV E/e' medial:  12.4 LV SV:         66 LV SV Index:   42 LVOT Area:     2.66 cm  RIGHT VENTRICLE             IVC RV Basal diam:  3.00 cm     IVC diam: 1.70 cm RV S prime:     12.60 cm/s TAPSE (M-mode): 2.5 cm LEFT ATRIUM             Index       RIGHT ATRIUM           Index LA diam:        3.20 cm 2.01 cm/m  RA Area:     12.50 cm LA Vol (A2C):   31.2 ml 19.58 ml/m RA Volume:   24.50 ml  15.38 ml/m LA Vol (A4C):   27.1 ml 17.01 ml/m LA Biplane Vol: 30.9 ml 19.39 ml/m  AORTIC VALVE AV Area (Vmax):    1.68 cm AV Area (Vmean):   1.61 cm AV Area (VTI):     1.90 cm  AV Vmax:           155.00 cm/s AV Vmean:          106.000 cm/s AV VTI:            0.349 m AV Peak Grad:      9.6 mmHg AV Mean Grad:      5.0 mmHg LVOT Vmax:         97.90 cm/s LVOT Vmean:        64.300 cm/s LVOT VTI:          0.249 m LVOT/AV VTI ratio: 0.71  AORTA Ao Root diam: 2.60 cm MITRAL VALVE MV Area (PHT): 2.48 cm    SHUNTS MV Decel  Time: 306 msec    Systemic VTI:  0.25 m MV E velocity: 86.10 cm/s  Systemic Diam: 1.84 cm MV A velocity: 91.70 cm/s MV E/A ratio:  0.94 Theresa Croitoru MD Electronically signed by Thurmon Fair MD Signature Date/Time: 07/10/2020/3:58:14 PM    Final     PHYSICAL EXAM Pleasant elderly Caucasian lady sitting comfortably in bed.  Not in distress. . Afebrile. Head is nontraumatic. Neck is supple without bruit.    Cardiac exam no murmur or gallop. Lungs are clear to auscultation. Distal pulses are well felt. Neurological Exam :  She is awake alert oriented to 2 person and place.  Diminished attention, registration and recall.  Speech is slightly hesitant but mostly fluent without paraphasic errors.  Able to name and repeat well.  Follows simple 1 and occasional two-step commands.  Extraocular movements appear full range but she has some slight left gaze preference but able to look to the right and asked to do so.  She has diminished vision acuity in the right eye and only finger counting at 2 to 3 feet.  She blinks to threat on the left but not on the right.  Face is symmetric without weakness.  Tongue midline.  Motor system exam shows symmetric upper and lower extremity strength without drift or focal weakness.  Finger-to-nose and needle coordination is slow but accurate.  Touch pinprick sensation are preserved.  She has mild right-sided inattention on double simultaneous testing.  Gait not tested. ASSESSMENT/PLAN Theresa Landry is a 86 y.o. female with history of HTN presenting with confusion and aphasia, R field cut, dysarthria and neglect.   Stroke:   Multiple small L MCA infarcts (mild petechial hemorrhage) embolic secondary to unknown source, suspicious for AF  Code Stroke CT head No acute abnormality. ASPECTS 10.     CTA head & neck no LVO. L M2 atherosclerosis. B ICA origins < 50% stenosis.  CT perfusion no core or penumbra  MRI  Multiple small L MCA infarct.small L parietal cortex petechial  hemorrhage  2D Echo EF 60-65%. No source of embolus   EEG continuous slowing. No sz  Will ask a [facility] electrophysiologist will consult and consider placement of an implantable loop recorder to evaluate for atrial fibrillation as etiology of stroke prior to d/c.  This has been explained to patient/family by Dr. Pearlean Brownie and they are agreeable.   LDL 105  HgbA1c 5.6  VTE prophylaxis - SCDs   No antithrombotic prior to admission, now on No antithrombotic. Add DAPT x 3 weeks then aspirin along  Therapy recommendations:  SNF  Disposition:  pending  (lived alone PTA)  Hypertension  Stable . Permissive hypertension (OK if < 220/120) but gradually normalize in 5-7 days . Long-term BP goal normotensive  Hyperlipidemia  Home meds:  No statin  Now on lipitor 80  LDL 105, goal < 70  Continue statin at discharge  Other Stroke Risk Factors  Advanced age  Former Cigarette smoker  ETOH use, advised to drink no more than 1 drink(s) a day  Hx stroke/TIA  Hx ocular infarct in the past per pt  Family hx stroke (father)  Other Active Problems  COPD  First degree AV block  Hospital day # 2 She presented with embolic left MCA branch infarct likely from undiagnosed atrial fibrillation.  Recommend loop recorder insertion for paroxysmal A. fib.  Prior to discharge to skilled nursing facility for rehab.  Continue aspirin and Plavix for 3 weeks followed by aspirin alone and aggressive risk factor modification.  Stroke team will sign off.  Kindly call for questions.   Delia Heady, MD To contact Stroke Continuity provider, please refer to [email or web address]. After hours, contact General Neurology

## 2020-07-12 ENCOUNTER — Encounter (HOSPITAL_COMMUNITY): Payer: Self-pay | Admitting: Cardiology

## 2020-07-12 LAB — CBC
HCT: 38.6 % (ref 36.0–46.0)
Hemoglobin: 12.7 g/dL (ref 12.0–15.0)
MCH: 33.4 pg (ref 26.0–34.0)
MCHC: 32.9 g/dL (ref 30.0–36.0)
MCV: 101.6 fL — ABNORMAL HIGH (ref 80.0–100.0)
Platelets: 222 10*3/uL (ref 150–400)
RBC: 3.8 MIL/uL — ABNORMAL LOW (ref 3.87–5.11)
RDW: 14.3 % (ref 11.5–15.5)
WBC: 10.7 10*3/uL — ABNORMAL HIGH (ref 4.0–10.5)
nRBC: 0 % (ref 0.0–0.2)

## 2020-07-12 LAB — BASIC METABOLIC PANEL
Anion gap: 10 (ref 5–15)
BUN: 13 mg/dL (ref 8–23)
CO2: 23 mmol/L (ref 22–32)
Calcium: 9.5 mg/dL (ref 8.9–10.3)
Chloride: 103 mmol/L (ref 98–111)
Creatinine, Ser: 0.75 mg/dL (ref 0.44–1.00)
GFR calc Af Amer: >60 mL/min (ref >60)
GFR calc non Af Amer: >60 mL/min (ref >60)
Glucose, Bld: 116 mg/dL — ABNORMAL HIGH (ref 70–99)
Potassium: 3.6 mmol/L (ref 3.5–5.1)
Sodium: 136 mmol/L (ref 135–145)

## 2020-07-12 NOTE — Progress Notes (Signed)
Physical Therapy Treatment Patient Details Name: Theresa Landry MRN: [id number] DOB: [birth year] Today's Date: 07/12/2020    History of Present Illness Pt is a 86 y.o. female with PMH of bronchiectasis, asthma, GERD, essential HTN, adjustment disorder with anxious mood, and mild cognitive impairment, acquired hypothyroidism, rheumatoid arthritis, and bilateral sensorineural hearing loss presenting with aphasia and right-sided weakness. MRI showed multiple small areas of acute infarct in the left MCA distribution and left caudate body with a small area of acute hemorrhage in the left parietal cortex.    PT Comments    Patient remains confused with inability to state why she is in the hospital and cannot recall information 5 minutes after being told. She continues with Rt inattention (only pulling up the left side of her underwear; not realizing Rt hand tangled in her gown as she reached for rt hand grip on RW). Continues to require cues to avoid objects on her right side. Patient reports she would do better with a cane as that is what she is accustomed to using. Will try next visit--anticipate will not be safe with cane as she carries it in her rt hand.     Follow Up Recommendations  SNF;Supervision/Assistance - 24 hour     Equipment Recommendations  None recommended by PT    Recommendations for Other Services       Precautions / Restrictions Precautions Precautions: Fall    Mobility  Bed Mobility Overal bed mobility: Needs Assistance Bed Mobility: Supine to Sit     Supine to sit: Supervision     General bed mobility comments: HOB ~20;   Transfers Overall transfer level: Needs assistance Equipment used: Rolling walker (2 wheeled);1 person hand held assist Transfers: Sit to/from Stand Sit to Stand: Min assist         General transfer comment: pt required min assist for power up to standing and for stabilizing  Ambulation/Gait Ambulation/Gait assistance: Min  assist Gait Distance (Feet): 35 Feet (x2 (with RW vs HHA)) Assistive device: Rolling walker (2 wheeled);1 person hand held assist Gait Pattern/deviations: Step-through pattern;Decreased step length - right;Decreased step length - left;Decreased stride length;Decreased dorsiflexion - right;Decreased dorsiflexion - left Gait velocity: decreased   General Gait Details: pt demonstrated slowed gait with decreased stride length and step length. Pt was noted to have decreased heel strike with ambulation. pt required max multimodal cueing for RW sequencing and proximity to device.  Pt also cued for keeping upright posture in gait. Pt tended to run into objects on the R side secondary to R sided inattention and required max multimodal cues for assistance with obstacle navigation; pt felt difficulties due to use of RW and therefore simulated cane via HHA as pt used cane PRN prior to admission   Stairs             Wheelchair Mobility    Modified Rankin (Stroke Patients Only) Modified Rankin (Stroke Patients Only) Pre-Morbid Rankin Score: Moderate disability Modified Rankin: Moderately severe disability     Balance Overall balance assessment: Needs assistance Sitting-balance support: No upper extremity supported;Feet supported Sitting balance-Leahy Scale: Fair     Standing balance support: During functional activity;No upper extremity supported;Bilateral upper extremity supported Standing balance-Leahy Scale: Fair Standing balance comment: pt able to maintain standing balance at edge of sink without UE support                            Cognition Arousal/Alertness: Awake/alert Behavior During Therapy: [facility]  for tasks assessed/performed Overall Cognitive Status: No family/caregiver present to determine baseline cognitive functioning Area of Impairment: Orientation;Attention;Memory;Following commands;Safety/judgement;Awareness;Problem solving                 Orientation  Level: Disoriented to;Place;Time;Situation Current Attention Level: Sustained Memory: Decreased recall of precautions;Decreased short-term memory Following Commands: Follows one step commands with increased time;Follows multi-step commands inconsistently Safety/Judgement: Decreased awareness of safety;Decreased awareness of deficits Awareness: Intellectual Problem Solving: Slow processing;Decreased initiation;Difficulty sequencing;Requires verbal cues;Requires tactile cues General Comments: Patient unable to recall diagnosis; upset with MD because she has not been told why she is here (told had CVA and she could not recall 5 minutes later); thinks she's been hospitalized 2 weeks (instead of 3 days)      Exercises Other Exercises Other Exercises: sit to stand from recliner without UE assist x 3    General Comments        Pertinent Vitals/Pain Pain Assessment: No/denies pain    Home Living                      Prior Function            PT Goals (current goals can now be found in the care plan section) Acute Rehab PT Goals Patient Stated Goal: none stated Time For Goal Achievement: 07/24/20 Potential to Achieve Goals: Fair Progress towards PT goals: Progressing toward goals    Frequency    Min 3X/week      PT Plan Current plan remains appropriate    Co-evaluation              AM-PAC PT "6 Clicks" Mobility   Outcome Measure  Help needed turning from your back to your side while in a flat bed without using bedrails?: None Help needed moving from lying on your back to sitting on the side of a flat bed without using bedrails?: None Help needed moving to and from a bed to a chair (including a wheelchair)?: A Little Help needed standing up from a chair using your arms (e.g., wheelchair or bedside chair)?: A Little Help needed to walk in hospital room?: A Little Help needed climbing 3-5 steps with a railing? : A Lot 6 Click Score: 19    End of Session  Equipment Utilized During Treatment: Gait belt Activity Tolerance: Patient tolerated treatment well Patient left: in chair;with call bell/phone within reach;with chair alarm set Nurse Communication: Mobility status;Other (comment) (pt up in chair) PT Visit Diagnosis: Unsteadiness on feet (R26.81);Other abnormalities of gait and mobility (R26.89);Muscle weakness (generalized) (M62.81);Other symptoms and signs involving the nervous system (R29.898);Difficulty in walking, not elsewhere classified (R26.2)     Time: 1520-1550 PT Time Calculation (min) (ACUTE ONLY): 30 min  Charges:  $Gait Training: 8-22 mins $Therapeutic Exercise: 8-22 mins                      Jerolyn Center, PT Pager [phone]    Zena Amos 07/12/2020, 4:56 PM

## 2020-07-12 NOTE — Progress Notes (Signed)
   Subjective: Day 3 forMs. Landry,86y.o. femaleliving with HTN & HLDhere with anacute left MCA stroke suspecting embolic etiology likely 2/2 an AF.   Patient was interviewed at bedside and slept okay last night. She states that the implant of the loop recorder went well. Patient reports she wants to go home, and team discussed with patient the plan placement at a temporary rehab facility before she could return home. Unclear if patient fully understood.   Objective:  Vital signs in last 24 hours: Vitals:   07/11/20 2133 07/12/20 0329 07/12/20 0400 07/12/20 0741  BP:   127/66 139/69  Pulse:   62 (!) 59  Resp:   17 15  Temp:   97.9 F (36.6 C) 98 F (36.7 C)  TempSrc:   Oral Oral  SpO2: 95%  98% 98%  Weight:  65.3 kg    Height:       Weight change: 1.018 kg  Intake/Output Summary (Last 24 hours) at 07/12/2020 1018 Last data filed at 07/12/2020 0500 Gross per 24 hour  Intake 480 ml  Output --  Net 480 ml   Physical Exam: GEN: NAD, lying in bed CV: RRR, no murmurs, rubs, or gallops EXT: No edema PSYCH: Cooperative but confused and disoriented  Assessment/Plan: This is hospital day 3 forMs. Landry,86y.o. femaleliving with HTN & HLDhere with anacute left MCA stroke suspecting embolic etiology likely 2/2 an AF.   Principal Problem:   CVA (cerebral vascular accident) [facility]) Active Problems:   Essential hypertension   Cerebral hemorrhage (HCC)   Aphasia Acute left MCAstroke. Patient currently has expressive aphasia without dysarthria and right hemineglect. Patient underwent a successful loop recorder placement yesterday. Currently, awaiting SNF placement for temporary rehab before she is able to return home since she lives alone. Will continue DAPT + statin regimen to help prevent potential reoccurring strokes. Loop recorder will continue to monitor for suspected embolic origin, likely a paroxysmal a. fib but not confirmed yet. Otherwise, patient is medically  stable for discharge.  Plan: --Awaiting SNF placement  --Continue loop recorder --DAPT (Plavix 75 mg & ASA 81 mg) for three weeks, followed by aspirin alone indefinitely --Atorvastatin 80 mg   Essential HTN. Intermittent HTN throughout hospital course. Cardizem held due to concern of bradycardia. Continue amlodipine 5 mg daily. Most recent BP was 139/69.  Plan: --HoldingCardizem due to intermittent bradycardia --Continue Amlodipine 5 mg daily --Continue Hydralazine injection 5 mg q4h PRN if BP>150 if patient remains in the hospital   HLD. Elevated LDL (105) for lipid panel on admission, goal is <70. Started on lipitor (atorvastatin) 80 mg and will continue going forward.  Plan:  --Continue atorvastatin 80 mg   COPD. Appears to be well controlled, currently in no acute distress.  Plan: --ContinueAlbuterol nebulizers q6hPRN for wheezing/SOB --ContinueDulera 2puffs BID   FirstDegree AV Block. Found to have a first degree AV block, appears to be chronic based off of ECG from 05/21/2019. Plan: --Continuetelemetry monitoring if patient remains in the hospital    LOS: 3 days   Clemetine Marker, Medical Student 07/12/2020, 10:18 AM

## 2020-07-12 NOTE — TOC Progression Note (Signed)
Transition of Care [facility]) - Progression Note    Patient Details  Name: Denisa Enterline MRN: [id number] Date of Birth: 1932-12-29  [facility] CM/SW Contact  Baldemar Lenis, [place] Phone Number: 07/12/2020, 2:36 PM  Clinical Narrative:   CSW spoke with patient's sister, Jamesetta So, today to discuss bed offers. Phyllis asked for [facility], [facility], and [facility]. [facility] is not available as they have no beds, but [facility] and [facility] are reviewing referral. CSW to start insurance authorization when a SNF has been chosen.    Expected Discharge Plan: Skilled Nursing Facility Barriers to Discharge: English as a second language teacher, Continued Medical Work up, Requiring sitter/restraints  Expected Discharge Plan and Services Expected Discharge Plan: Skilled Nursing Facility     Post Acute Care Choice: Skilled Nursing Facility Living arrangements for the past 2 months: Single Family Home                                       Social Determinants of Health (SDOH) Interventions    Readmission Risk Interventions No flowsheet data found.

## 2020-07-13 DIAGNOSIS — I63512 Cerebral infarction due to unspecified occlusion or stenosis of left middle cerebral artery: Secondary | ICD-10-CM

## 2020-07-13 DIAGNOSIS — J449 Chronic obstructive pulmonary disease, unspecified: Secondary | ICD-10-CM

## 2020-07-13 DIAGNOSIS — E785 Hyperlipidemia, unspecified: Secondary | ICD-10-CM

## 2020-07-13 MED ORDER — QUETIAPINE FUMARATE 25 MG PO TABS
25.0000 mg | ORAL_TABLET | Freq: Every day | ORAL | Status: DC
  Administered 2020-07-13 – 2020-07-16 (×4): 25 mg via ORAL
  Filled 2020-07-13 (×4): qty 1

## 2020-07-13 MED ORDER — ESCITALOPRAM OXALATE 10 MG PO TABS
20.0000 mg | ORAL_TABLET | Freq: Every day | ORAL | Status: DC
  Administered 2020-07-14 – 2020-07-17 (×4): 20 mg via ORAL
  Filled 2020-07-13 (×4): qty 2

## 2020-07-13 MED ORDER — ENOXAPARIN SODIUM 40 MG/0.4ML ~~LOC~~ SOLN
40.0000 mg | SUBCUTANEOUS | Status: DC
  Administered 2020-07-13 – 2020-07-16 (×4): 40 mg via SUBCUTANEOUS
  Filled 2020-07-13 (×4): qty 0.4

## 2020-07-13 NOTE — Progress Notes (Signed)
  Speech Language Pathology Treatment: Cognitive-Linquistic;Dysphagia  Patient Details Name: Theresa Landry MRN: [id number] DOB: 06-02-1933 Today's Date: 07/13/2020 Time: 1540-1600 SLP Time Calculation (min) (ACUTE ONLY): 20 min  Assessment / Plan / Recommendation Clinical Impression  Pt was seen for treatment and cooperative throughout the session. Pt reported that she been tolerating the current diet without overt s/sx of aspiration and no issues have been documented by RN/nurse tech. Pt tolerated regular texture solids, mixed consistency boluses (mechanical soft with thin liquids) and thin liquids via straw using consecutive swallows without s/sx of aspiration. Mastication time was functional and no significant oral residue was noted. Her language skills were notably improved compared to the initial evaluation. She was able to participate in conversation with only intermittent word retrieval difficulty and produced multiple sentences without difficulty. Repetition was frequently needed for auditory comprehension of complex sentences. She provided 11 items per concrete category during divergent naming tasks. Additional structured language tasks were deferred and the session abbreviated since the pt requested to be transferred back to bed and the nurse tech arrived to transfer the pt. It is recommended that the current diet be continued. Further skilled SLP services do not appear to be needed for swallowing but aphasia treatment will be continued.   HPI HPI: Pt is an 86 yo female presenting with AMS and difficulty speaking. MRI showed multiple small areas of acute infarction in the L MCA distribution as well as a small area of hemorrhage in the L parietal cortex. Pt passed the [place] swallow screen but CXR showed a RLL opacity centered upon several thickened airways, suspicious for PNA or aspiration. PMH includes: asthma, arthritis, HTN, COPD      SLP Plan  Continue with current plan of care        Recommendations  Diet recommendations: Regular;Thin liquid Liquids provided via: Cup;Straw Medication Administration: Whole meds with liquid Supervision: Patient able to self feed (set-up assistance) Compensations: Slow rate;Small sips/bites Postural Changes and/or Swallow Maneuvers: Seated upright 90 degrees                Oral Care Recommendations: Oral care BID Follow up Recommendations: Skilled Nursing facility SLP Visit Diagnosis: Aphasia (R47.01);Dysphagia, unspecified (R13.10) Plan: Continue with current plan of care       Regginald Pask I. Vear Clock, MS, CCC-SLP Acute Rehabilitation Services Office number [phone] Pager [phone]                 Scheryl Marten 07/13/2020, 5:57 PM

## 2020-07-13 NOTE — Progress Notes (Signed)
07/13/20 1100  PT Visit Information  Last PT Received On 07/13/20  Assistance Needed +1  History of Present Illness Pt is a 86 y.o. female with PMH of bronchiectasis, asthma, GERD, essential HTN, adjustment disorder with anxious mood, and mild cognitive impairment, acquired hypothyroidism, rheumatoid arthritis, and bilateral sensorineural hearing loss presenting with aphasia and right-sided weakness. MRI showed multiple small areas of acute infarct in the left MCA distribution and left caudate body with a small area of acute hemorrhage in the left parietal cortex. s/p Loop recorder 07/13/2020   Subjective Data  Patient Stated Goal get home  Precautions  Precautions Fall  Restrictions  Weight Bearing Restrictions No  Pain Assessment  Pain Assessment No/denies pain  Cognition  Arousal/Alertness Awake/alert  Behavior During Therapy WFL for tasks assessed/performed  Overall Cognitive Status No family/caregiver present to determine baseline cognitive functioning  Area of Impairment Attention;Memory;Following commands;Safety/judgement;Awareness;Problem solving  Current Attention Level Sustained  Memory Decreased short-term memory  Following Commands Follows one step commands with increased time;Follows multi-step commands inconsistently;Follows one step commands consistently  Safety/Judgement Decreased awareness of safety;Decreased awareness of deficits  Awareness Emergent  Problem Solving Slow processing;Decreased initiation;Difficulty sequencing;Requires verbal cues;Requires tactile cues  General Comments Pt unable to remember therapist name after being introduced at being of session. pt able to follow simple one step commands but difficulty with mutli-step commands. Pt still demonstrating difficulty sequencing. She was more aware of R side   Bed Mobility  General bed mobility comments pt in recliner when entered the room  Transfers  Overall transfer level Needs assistance  Equipment used  None  Transfers Sit to/from Stand  Sit to Stand Min guard  General transfer comment pt required min guard for safety with noted increased power up time to standing  Ambulation/Gait  Ambulation/Gait assistance Min guard;Min assist  Gait Distance (Feet) 60 Feet  Assistive device Straight cane  Gait Pattern/deviations Step-through pattern;Decreased step length - right;Decreased step length - left;Decreased stride length;Decreased dorsiflexion - right;Decreased dorsiflexion - left  General Gait Details pt demonstrated a slowed gait with straight cane. She demonstrated decreased bilat stride length and step length. Pt required multimodal cueing for cane proximity and sequencing in ambulation. Pt was noted to have less R inattention as she did not run into obstacles on her R. Worked on visual tracking to attend to the R side during ambulation. Pt required min gaurd to min assist with cane  Gait velocity decreased  Modified Rankin (Stroke Patients Only)  Pre-Morbid Rankin Score 3  Modified Rankin 4  Balance  Overall balance assessment Needs assistance  Sitting-balance support No upper extremity supported;Feet supported  Sitting balance-Leahy Scale Fair  Sitting balance - Comments pt required only supervison for sitting at EOB  Standing balance support During functional activity;Single extremity supported;No upper extremity supported  Standing balance-Leahy Scale Fair  Standing balance comment pt able to maintain standing balance without UE support. Pt only required min guard assist for safety   General Comments  General comments (skin integrity, edema, etc.) performed peripheral field vision tasks by having pts count how many fingers PT was holding. Pt able to get 2/3 correct on R visual field  PT - End of Session  Equipment Utilized During Treatment Gait belt  Activity Tolerance Patient tolerated treatment well  Patient left in chair;with call Melanny Wire/phone within reach;with nursing/sitter in room   Nurse Communication Mobility status   PT - Assessment/Plan  PT Plan Current plan remains appropriate  PT Visit Diagnosis Unsteadiness on feet (R26.81);Muscle  weakness (generalized) (M62.81)  PT Frequency (ACUTE ONLY) Min 3X/week  Follow Up Recommendations SNF;Supervision for mobility/OOB  PT equipment None recommended by PT  AM-PAC PT "6 Clicks" Mobility Outcome Measure (Version 2)  Help needed turning from your back to your side while in a flat bed without using bedrails? 4  Help needed moving from lying on your back to sitting on the side of a flat bed without using bedrails? 4  Help needed moving to and from a bed to a chair (including a wheelchair)? 3  Help needed standing up from a chair using your arms (e.g., wheelchair or bedside chair)? 3  Help needed to walk in hospital room? 3  Help needed climbing 3-5 steps with a railing?  2  6 Click Score 19  Consider Recommendation of Discharge To: Home with [facility]  PT Goal Progression  Progress towards PT goals Progressing toward goals  Acute Rehab PT Goals  PT Goal Formulation With patient  Time For Goal Achievement 07/24/20  Potential to Achieve Goals Fair  PT Time Calculation  PT Start Time (ACUTE ONLY) 1116  PT Stop Time (ACUTE ONLY) 1135  PT Time Calculation (min) (ACUTE ONLY) 19 min  PT General Charges  $$ ACUTE PT VISIT 1 Visit  PT Treatments  $Gait Training 8-22 mins   Pt is progressing well towards goals. Pt was min guard to min assist for all mobility tasks with straight cane. Pt noted to be more aware of R side; however, continue to demonstrate cognitive deficits. Worked on R sided visual scanning tasks during ambulation to address R sided inattention. Current d/c plans remain appropriate. Pt would continue to benefit from acute care therapy in order to return her to PLOF. Will continue to follow acutely.   Harmon Pier, SPT  Acute Rehabilitation Services  Office: [phone]

## 2020-07-13 NOTE — TOC Progression Note (Addendum)
Transition of Care [facility]) - Progression Note    Patient Details  Name: Tonga Prout MRN: [id number] Date of Birth: 04/05/1933  Transition of Care [facility]) CM/SW [place], [place] Phone Number: 07/13/2020, 3:58 PM  Clinical Narrative:   CSW following for discharge plans. CSW received bed offers from [facility] for the patient. CSW attempted to contact patient's sister all morning, got her after lunch. CSW met with patient and patient's sister at bedside to discuss SNF offers, and patient's sister chose [facility]. [place] spoke with patient's son about financial information. [place] unable to admit new patients over the weekend as they have no admission nurse, so plan is to admit to SNF on Monday. CSW updated patient and MD with barriers to discharge. CSW to follow.  UPDATE: CSW received call from patient's sister, Silva Bandy, asking if patient's significant other, Ron, can be the patient's other visitor. Per Silva Bandy, having the significant other visit may help to improve the patient's mental status as she keeps asking for him. CSW added Ron to the contact list, and updated MD and RN with information. Treatment team sticky note added to patient's chart with information.     Expected Discharge Plan: Skilled Nursing Facility Barriers to Discharge: Ship broker, Continued Medical Work up, Requiring sitter/restraints  Expected Discharge Plan and Services Expected Discharge Plan: [place] Choice: [place] arrangements for the past 2 months: Single Family Home                                       Social Determinants of Health (SDOH) Interventions    Readmission Risk Interventions No flowsheet data found.

## 2020-07-13 NOTE — Progress Notes (Signed)
   Subjective:  Theresa Landry is a 86 y.o. with PMH of HTN, HLD admit for acute L MCA stroke on hospital day 4  Theresa Landry was examined and evaluated and she was noted to be sitting at bedside chair this am. She mentions feeling sad and feels she has not been regularly updated regarding her clinical course. She states she wishes to be discharged home. Discussed safety at home and need to be d/ced to SNF.  Objective:  Vital signs in last 24 hours: Vitals:   07/13/20 0349 07/13/20 0804 07/13/20 0913 07/13/20 1513  BP:   (!) 106/47 118/60  Pulse:    68  Resp:    18  Temp:    98.1 F (36.7 C)  TempSrc:    Oral  SpO2:  99%  97%  Weight: 65.8 kg     Height:       Gen:  NAD HEENT: NCAT head, diminished hearing, MMM Pulm: Breathing comfortably on room air, no cough, no distress  Extm: ROM intact, No peripheral edema Skin: Dry, Warm, normal turgor, stable loop recorder insertion site w/o surrounding erythema or drainage Psych: Sad, tearful  Assessment/Plan:  Principal Problem:   CVA (cerebral vascular accident) (HCC) Active Problems:   Essential hypertension   Cerebral hemorrhage (HCC)   Aphasia  [name] is a 86 y.o. with PMH of HTN, HLD admit for acute L MCA stroke.  Acute L MCA stroke On admission found to have hemi-neglect and expressive aphasia. MR showing left MCA infarcts w/ small hemorrhage. Currently on DAPT. Neurological deficits appear to be improving Disorientation appear to be due to sundowning overnight. Currently has 1 to 1 sitter. OT/PT recommending SNF. Awaiting bed placement. - Appreciate social work: awaiting placement - F/u loop recordered - DAPT for 3 weeks + aspirin alone - C/w atorvastatin 80mg  daily - Delirium precautions - Will restart DVT prophylaxis since 4 days s/p bleed w/o evidence of acute bleed  Essential Hypertension Am bp 118/60. Currently on amlodipine 5mg . Was on diltiazem at home which was held on admission. -  C/w  amlodipine  COPD Not in acute exacerbation. Satting well on room air - C/w dulera 2 puffs bid  DVT prophx: lovenox Diet: Cardiac Bowel: Miralax Code: Full  Prior to Admission Living Arrangement: Home Anticipated Discharge Location: SNF Barriers to Discharge: placement Dispo: Anticipated discharge in approximately 1-2 day(s).   , MD 07/13/2020, 3:23 PM Pager: [phone] After 5pm on weekdays and 1pm on weekends: On Call Pager: [phone]

## 2020-07-13 NOTE — Progress Notes (Signed)
Occupational Therapy Treatment Patient Details Name: Theresa Landry MRN: [id number] DOB: Jul 17, 1933 Today's Date: 07/13/2020    History of present illness Pt is a 86 y.o. female with PMH of bronchiectasis, asthma, GERD, essential HTN, adjustment disorder with anxious mood, and mild cognitive impairment, acquired hypothyroidism, rheumatoid arthritis, and bilateral sensorineural hearing loss presenting with aphasia and right-sided weakness. MRI showed multiple small areas of acute infarct in the left MCA distribution and left caudate body with a small area of acute hemorrhage in the left parietal cortex.   OT comments  Pt. Seen for skilled OT treatment session.  Pt. Able to complete LB dressing and standign grooming task min guard a.  Good use of R ue during grooming tasks.  Min cues required for rw management.  Very talkative and able to recall short term details of conversation and events from beginning of session to end with min. Cueing. Notable word finding issues throughout conversation but aware of it and would take a moment to attempt to find the word.    Follow Up Recommendations  SNF;Supervision/Assistance - 24 hour    Equipment Recommendations       Recommendations for Other Services      Precautions / Restrictions Precautions Precautions: Fall Restrictions Weight Bearing Restrictions: No       Mobility Bed Mobility               General bed mobility comments: pt in recliner when entered the room  Transfers Overall transfer level: Needs assistance Equipment used: Rolling walker (2 wheeled) Transfers: Sit to/from [employer] Sit to Stand: Min guard Stand pivot transfers: Min guard       General transfer comment: no physical assistance required for tranfers    Balance Overall balance assessment: Needs assistance Sitting-balance support: No upper extremity supported;Feet supported Sitting balance-Leahy Scale: Fair Sitting balance - Comments:  pt required only supervison for sitting at EOB   Standing balance support: During functional activity;Single extremity supported Standing balance-Leahy Scale: Fair Standing balance comment: pt able to maintain standing balance without UE support. Pt only required min guard assist for safety                            ADL either performed or assessed with clinical judgement   ADL Overall ADL's : Needs assistance/impaired     Grooming: Brushing hair;Standing;Min guard Grooming Details (indicate cue type and reason): pt. stood at sink to comb hair with R ue no lob noted. also applied lotion to B hands in standing no lob, min. a to open lotion container             Lower Body Dressing: Sitting/lateral leans Lower Body Dressing Details (indicate cue type and reason): seated figure 4 technique to adjust socks Toilet Transfer: Min Administrator Details (indicate cue type and reason): simulated to recliner         Functional mobility during ADLs: Min guard;Rolling walker General ADL Comments: noted improvement from previous sessions. utilization of r ue without cues     Vision       Perception     Praxis      Cognition Arousal/Alertness: Awake/alert Behavior During Therapy: WFL for tasks assessed/performed Overall Cognitive Status: No family/caregiver present to determine baseline cognitive functioning Area of Impairment: Orientation;Attention;Memory;Following commands;Safety/judgement;Awareness;Problem solving                   Current Attention Level: Sustained Memory: Decreased short-term  memory Following Commands: Follows one step commands inconsistently;Follows one step commands with increased time;Follows multi-step commands inconsistently Safety/Judgement: Decreased awareness of safety;Decreased awareness of deficits Awareness: Intellectual Problem Solving: Slow processing;Decreased initiation;Difficulty sequencing;Requires verbal  cues;Requires tactile cues General Comments: noted improvement from previous sessions! able to recall multiple questions with consistent answers from beg., middle, and end of session. describing details of her life past, and present.        Exercises     Shoulder Instructions       General Comments pt was noted to do well with attention to the R in ambulation and finger counting exercises    Pertinent Vitals/ Pain       Pain Assessment: No/denies pain  Home Living                                          Prior Functioning/Environment              Frequency  Min 2X/week        Progress Toward Goals  OT Goals(current goals can now be found in the care plan section)  Progress towards OT goals: Progressing toward goals  Acute Rehab OT Goals Patient Stated Goal: get home  Plan      Co-evaluation                 AM-PAC OT "6 Clicks" Daily Activity     Outcome Measure   Help from another person eating meals?: A Little Help from another person taking care of personal grooming?: A Little Help from another person toileting, which includes using toliet, bedpan, or urinal?: A Little Help from another person bathing (including washing, rinsing, drying)?: A Little Help from another person to put on and taking off regular upper body clothing?: A Little Help from another person to put on and taking off regular lower body clothing?: A Little 6 Click Score: 18    End of Session Equipment Utilized During Treatment: Gait belt;Rolling walker  OT Visit Diagnosis: Unsteadiness on feet (R26.81);Muscle weakness (generalized) (M62.81);Other symptoms and signs involving the nervous system (R29.898);Other symptoms and signs involving cognitive function;Low vision, both eyes (H54.2)   Activity Tolerance Patient tolerated treatment well   Patient Left in chair;with call bell/phone within reach;with chair alarm set;Other (comment) (found sitter who was re  entering the room and notified her that i had set the chair alarm)   Nurse Communication Mobility status        Time: 0881-1031 OT Time Calculation (min): 22 min  Charges: OT General Charges $OT Visit: 1 Visit OT Treatments $Self Care/Home Management : 8-22 mins  Boneta Lucks, COTA/L Acute Rehabilitation [phone]   Robet Leu 07/13/2020, 1:01 PM

## 2020-07-14 NOTE — Progress Notes (Signed)
   Subjective:  Theresa Landry is a 86 y.o. with PMH of HTN, HLD admit for acute L MCA stroke on hospital day 5  Theresa Landry was examined and evaluated and she was noted to be sitting at bedside chair this am. Spoke with Theresa Landry regarding plan to discharge to [facility] on Monday when they can receive her. Theresa Landry expressed understanding and mentions that as long as she knows the end-date, she is happy. Denies any depressive symptoms. Denies any focal deficits.  Objective:  Vital signs in last 24 hours: Vitals:   07/14/20 0402 07/14/20 0814 07/14/20 1321 07/14/20 1615  BP: 140/72 (!) 144/74 (!) 127/58 (!) 163/58  Pulse: 70 81 78 60  Resp: 18 18 18 16   Temp: 97.8 F (36.6 C) (!) 97.5 F (36.4 C) 97.8 F (36.6 C) (!) 97.5 F (36.4 C)  TempSrc: Axillary Oral Oral Oral  SpO2: 98% 100% 97% 99%  Weight:      Height:       Gen:  NAD HEENT: NCAT head, diminished hearing, MMM Pulm: Breathing comfortably on room air, no cough, no distress  Extm: ROM intact, No peripheral edema Skin: Dry, Warm, normal turgor, stable loop recorder insertion site w/o surrounding erythema or drainage Psych: Sad, tearful  Assessment/Plan:  Principal Problem:   CVA (cerebral vascular accident) (HCC) Active Problems:   Essential hypertension   Cerebral hemorrhage (HCC)   Aphasia  Theresa Landry is a 86 y.o. with PMH of HTN, HLD admit for acute L MCA stroke.  Acute L MCA stroke On admission found to have hemi-neglect and expressive aphasia. MR showing left MCA infarcts w/ small hemorrhage. Currently on DAPT. Neurological deficits continues to improve. Seroquel started overnight for delirium. Planned for discharge to [facility], but [facility] unable to accept patient until Monday - Appreciate social work support: D/c to Saturday on 07/16/20 - DAPT for 3 weeks + aspirin alone - C/w atorvastatin 80mg  daily - Delirium precautions - C/w seroquel 25mg   Essential  Hypertension Am bp 118/60. Currently on amlodipine 5mg . Was on diltiazem at home which was held on admission. -  C/w amlodipine  COPD Not in acute exacerbation. Satting well on room air - C/w dulera 2 puffs bid  DVT prophx: lovenox Diet: Cardiac Bowel: Miralax Code: Full  Prior to Admission Living Arrangement: Home Anticipated Discharge Location: SNF Barriers to Discharge: placement Dispo: Anticipated discharge in approximately 2 day(s).   09/15/20, MD 07/14/2020, 5:15 PM Pager: [phone] After 5pm on weekdays and 1pm on weekends: On Call Pager: [phone]

## 2020-07-14 NOTE — Plan of Care (Signed)

## 2020-07-15 DIAGNOSIS — I10 Essential (primary) hypertension: Secondary | ICD-10-CM

## 2020-07-15 LAB — URINALYSIS, ROUTINE W REFLEX MICROSCOPIC
Bacteria, UA: NONE SEEN
Bilirubin Urine: NEGATIVE
Glucose, UA: NEGATIVE mg/dL
Hgb urine dipstick: NEGATIVE
Ketones, ur: NEGATIVE mg/dL
Nitrite: NEGATIVE
Protein, ur: NEGATIVE mg/dL
Specific Gravity, Urine: 1.009 (ref 1.005–1.030)
pH: 5 (ref 5.0–8.0)

## 2020-07-15 LAB — SARS CORONAVIRUS 2 (TAT 6-24 HRS): SARS Coronavirus 2: NEGATIVE

## 2020-07-15 NOTE — Plan of Care (Signed)

## 2020-07-15 NOTE — Plan of Care (Signed)

## 2020-07-15 NOTE — Progress Notes (Addendum)
   Subjective: Interviewed patient at bedside.  She reports doing well today. She has no complaints. Discussed plan to discharge to [facility].   Objective:  Vital signs in last 24 hours: Vitals:   07/14/20 1929 07/14/20 2003 07/14/20 2336 07/15/20 0326  BP:  (!) 148/73 (!) 144/68 135/67  Pulse:  65 69 66  Resp:  18 16 18   Temp:  98 F (36.7 C) 98.3 F (36.8 C) 98.1 F (36.7 C)  TempSrc:  Oral Oral Oral  SpO2: 97% 98% 99% 99%  Weight:    69.4 kg  Height:       Physical Exam Vitals and nursing note reviewed.  Constitutional:      General: She is not in acute distress.    Appearance: Normal appearance. She is not ill-appearing or toxic-appearing.  HENT:     Head: Normocephalic and atraumatic.  Cardiovascular:     Rate and Rhythm: Normal rate and regular rhythm.     Pulses: Normal pulses.          Radial pulses are 2+ on the right side and 2+ on the left side.       Dorsalis pedis pulses are 2+ on the right side and 2+ on the left side.     Heart sounds: Normal heart sounds, S1 normal and S2 normal. No murmur heard.   Pulmonary:     Effort: Pulmonary effort is normal. No respiratory distress.     Breath sounds: Normal breath sounds. No wheezing.  Musculoskeletal:     Right lower leg: No edema.     Left lower leg: No edema.  Neurological:     Mental Status: She is alert. Mental status is at baseline.      Assessment/Plan:  Principal Problem:   CVA (cerebral vascular accident) [facility]) Active Problems:   Essential hypertension   Cerebral hemorrhage (HCC)   Aphasia   Theresa Landry is a 86 y.o. with PMH of HTN, HLD admit for acute L MCA stroke.   Acute L MCA stroke On admission found to have hemi-neglect and expressive aphasia. MR showing left MCA infarcts w/ small hemorrhage. Currently on DAPT. Neurological deficits continues to improve. Seroquel started yesterday for delirium. Planned for discharge to 85 - Appreciate social  work support: D/c to [facility] on 07/16/20 - DAPT for 3 weeks + aspirin alone -Continue atorvastatin 80 mg daily -Continue seroquel 25 mg will discontinue on discharge - Delirium precautions   Essential Hypertension Pressures have remained stable. Currently on amlodipine 5mg . Was on diltiazem at home which was held on admission. -Continue amlodipine   COPD: Not in acute exacerbation. Satting well on room air -Continue dulera 2 puffs bid   DVT prophx: lovenox Diet: Cardiac Bowel: Miralax Code: Full  Prior to Admission Living Arrangement: Home Anticipated Discharge Location: SNF Barriers to Discharge: SNF placement Dispo: Anticipated discharge in approximately 1 day(s).   09/15/20, MD 07/15/2020, 5:53 AM Pager: [phone] After 5pm on weekdays and 1pm on weekends: On Call pager [phone]

## 2020-07-15 NOTE — Social Work (Signed)
Requested new covid test from MD for Monday discharge to SNF.  Verlon Au, LCSWA Clinical Social Worker

## 2020-07-16 MED ORDER — AMLODIPINE BESYLATE 5 MG PO TABS: 5.0000 mg | ORAL_TABLET | Freq: Every day | ORAL | 0 refills | Status: AC

## 2020-07-16 MED ORDER — CLOPIDOGREL BISULFATE 75 MG PO TABS: 75.0000 mg | ORAL_TABLET | Freq: Every day | ORAL | 0 refills | Status: DC

## 2020-07-16 MED ORDER — CLOPIDOGREL BISULFATE 75 MG PO TABS
75.0000 mg | ORAL_TABLET | Freq: Every day | ORAL | 0 refills | Status: AC
Start: 2020-07-16 — End: 2020-07-31

## 2020-07-16 MED ORDER — ATORVASTATIN CALCIUM 80 MG PO TABS: 80.0000 mg | ORAL_TABLET | Freq: Every day | ORAL | 0 refills | Status: AC

## 2020-07-16 MED ORDER — ASPIRIN 81 MG PO TBEC: 81.0000 mg | DELAYED_RELEASE_TABLET | Freq: Every day | ORAL | 0 refills | Status: AC

## 2020-07-16 NOTE — Progress Notes (Signed)
   Subjective: Interviewed patient at bedside.  Did "fine" overnight, no issues. Currently sleepy but still waking up. Discussed going to rehab with patient, she is agreeable. She has no other complaints at this time.  Objective:  Vital signs in last 24 hours: Vitals:   07/15/20 1937 07/15/20 2055 07/15/20 2340 07/16/20 0341  BP: 133/60  118/65 120/65  Pulse: 65  61 66  Resp: 18  16 18   Temp: 98.1 F (36.7 C)  98 F (36.7 C) 98.3 F (36.8 C)  TempSrc: Oral  Oral Oral  SpO2: 98% 98% 99% 98%  Weight:    69.2 kg  Height:       Physical Exam Vitals and nursing note reviewed.  Constitutional:      General: She is not in acute distress.    Appearance: Normal appearance. She is normal weight. She is not ill-appearing or toxic-appearing.  HENT:     Head: Normocephalic and atraumatic.  Cardiovascular:     Rate and Rhythm: Normal rate and regular rhythm.     Pulses: Normal pulses.          Radial pulses are 2+ on the right side and 2+ on the left side.       Dorsalis pedis pulses are 2+ on the right side and 2+ on the left side.     Heart sounds: Normal heart sounds, S1 normal and S2 normal. No murmur heard.   Pulmonary:     Effort: Pulmonary effort is normal. No respiratory distress.     Breath sounds: Normal breath sounds. No wheezing.  Musculoskeletal:     Right lower leg: No edema.     Left lower leg: No edema.  Neurological:     Mental Status: She is alert. Mental status is at baseline.      Assessment/Plan:  Principal Problem:   CVA (cerebral vascular accident) [facility]) Active Problems:   Essential hypertension   Cerebral hemorrhage (HCC)   Aphasia   Kerryn Tennant is a 86 y.o. with PMH of HTN, HLD admit for acute L MCA stroke.   Acute L MCA stroke On admission found to have hemi-neglect and expressive aphasia. MR showing left MCA infarcts w/ small hemorrhage. Currently on DAPT. Neurological deficitscontinues to improve. Seroquel will be stopped.Discharge  to [facility] today. -Discharge to [facility]  -DAPT for 3 weeks then aspirin alone -Continue atorvastatin 80 mg daily -Stop the seroquel 25 mg    Essential Hypertension Pressures have remained stable. Currently on amlodipine 5mg . Was on diltiazem at home which was held on admission. -Continue amlodipine   COPD: Not in acute exacerbation. Satting well on room air -Continue dulera 2 puffs bid   DVT prophx: lovenox Diet: Cardiac Bowel: Miralax Code: Full   Prior to Admission Living Arrangement: Home Anticipated Discharge Location: SNF Barriers to Discharge: none Dispo: Anticipated discharge today.    [employer], MD 07/16/2020, 6:00 AM Pager: [phone] After 5pm on weekdays and 1pm on weekends: On Call pager [phone]

## 2020-07-16 NOTE — TOC Progression Note (Signed)
Transition of Care [facility]) - Progression Note    Patient Details  Name: Ezella Kell MRN: [id number] Date of Birth: June 19, 1933  Transition of Care [facility]) CM/SW Contact  Baldemar Lenis, [place] Phone Number: 07/16/2020, 1:24 PM  Clinical Narrative:   CSW following for discharge to SNF. CSW received insurance approval for patient to admit to [facility], sent summary to SNF and ensured patient could admit today. Patient's son never completed admission paperwork. CSW contacted him to ask him to send that, and he is at work and has no way of getting back home to do it until after 5 pm. [facility] is unable to admit patient without the paperwork. Son to complete paperwork tonight so that patient can admit to SNF. CSW to follow.    Expected Discharge Plan: Skilled Nursing Facility Barriers to Discharge: Other (comment) (POA never completed admission paperwork, unable to do so until tonight)  Expected Discharge Plan and Services Expected Discharge Plan: Skilled Nursing Facility     Post Acute Care Choice: Skilled Nursing Facility Living arrangements for the past 2 months: Single Family Home Expected Discharge Date: 07/12/20                                     Social Determinants of Health (SDOH) Interventions    Readmission Risk Interventions No flowsheet data found.

## 2020-07-16 NOTE — Progress Notes (Signed)
Physical Therapy Treatment Patient Details Name: Theresa Landry MRN: [id number] DOB: 1932/12/30 Today's Date: 07/16/2020    History of Present Illness Pt is a 86 y.o. female with PMH of bronchiectasis, asthma, GERD, essential HTN, adjustment disorder with anxious mood, and mild cognitive impairment, acquired hypothyroidism, rheumatoid arthritis, and bilateral sensorineural hearing loss presenting with aphasia and right-sided weakness. MRI showed multiple small areas of acute infarct in the left MCA distribution and left caudate body with a small area of acute hemorrhage in the left parietal cortex.    PT Comments    Patient continues to improve with cognition and awareness of deficits. She did have difficulty finding her trashcan (it was on her right side). Gait improving with pt stopping herself to regain her balance (or avoid losing her balance) while doing gait with head turns. Will continue to benefit from PT to incr strength, balance, and independence.    Follow Up Recommendations  SNF;Supervision for mobility/OOB     Equipment Recommendations  None recommended by PT    Recommendations for Other Services       Precautions / Restrictions Precautions Precautions: Fall    Mobility  Bed Mobility                  Transfers Overall transfer level: Needs assistance Equipment used: Straight cane Transfers: Sit to/from Stand Sit to Stand: Min guard         General transfer comment: incr effort and definite need for UE assist from recliner or toilet  Ambulation/Gait Ambulation/Gait assistance: Min guard;Min assist Gait Distance (Feet): 250 Feet Assistive device: Straight cane Gait Pattern/deviations: Step-through pattern;Decreased step length - right;Decreased step length - left;Decreased stride length;Decreased dorsiflexion - right;Decreased dorsiflexion - left Gait velocity: decreased   General Gait Details: Patient with decr velocity and at times  dyscoordination with use of cane in RUE. She could not incr her velocity with cues. She was able to perform head turns left and right and simulating looking on "upper and lower shelves" in the grocery store without frank imbalance, however did stop and restart her walking twice during activity.    Stairs             Wheelchair Mobility    Modified Rankin (Stroke Patients Only) Modified Rankin (Stroke Patients Only) Pre-Morbid Rankin Score: Moderate disability Modified Rankin: Moderately severe disability     Balance Overall balance assessment: Needs assistance Sitting-balance support: No upper extremity supported;Feet supported Sitting balance-Leahy Scale: Good Sitting balance - Comments: sitting edge of chair, able to pull up each sock with leaning down to her feet   Standing balance support: During functional activity;Single extremity supported;No upper extremity supported Standing balance-Leahy Scale: Fair Standing balance comment: pt able to maintain standing balance without UE support. Pt only required min guard assist for safety  Single Leg Stance - Right Leg: 12 (with bil UE support (single finger each hand); max 12 sec) Single Leg Stance - Left Leg: 30 (bil UE support (1st finger each hand))     Rhomberg - Eyes Opened: 30 (1 finger support to obtain and maintain position) Rhomberg - Eyes Closed: 12 (single finger support at counter)                Cognition Arousal/Alertness: Awake/alert Behavior During Therapy: WFL for tasks assessed/performed Overall Cognitive Status: No family/caregiver present to determine baseline cognitive functioning Area of Impairment: Following commands;Awareness;Problem solving  Following Commands: Follows one step commands with increased time;Follows multi-step commands inconsistently;Follows one step commands consistently (? also due to decr hearing vs processing)   Awareness: Emergent Problem  Solving: Slow processing;Difficulty sequencing;Requires verbal cues;Requires tactile cues General Comments: continued improvement; noted she is not to get up by herself and that she has an alarm on her chair; no running into objects on her right, but required cues to find the trash can when standing at the sink (it was on her right)      Exercises General Exercises - Lower Extremity Hip ABduction/ADduction: Strengthening;Both;20 reps;Standing (switching legs each rep; bil UE support) Heel Raises: Strengthening;Both;10 reps;Standing (bil 1 finger support on counter)    General Comments        Pertinent Vitals/Pain Pain Assessment: No/denies pain Faces Pain Scale: No hurt    Home Living                      Prior Function            PT Goals (current goals can now be found in the care plan section) Acute Rehab PT Goals Patient Stated Goal: get home Time For Goal Achievement: 07/24/20 Potential to Achieve Goals: Fair Progress towards PT goals: Progressing toward goals    Frequency    Min 3X/week      PT Plan Current plan remains appropriate    Co-evaluation              AM-PAC PT "6 Clicks" Mobility   Outcome Measure  Help needed turning from your back to your side while in a flat bed without using bedrails?: None Help needed moving from lying on your back to sitting on the side of a flat bed without using bedrails?: None Help needed moving to and from a bed to a chair (including a wheelchair)?: A Little Help needed standing up from a chair using your arms (e.g., wheelchair or bedside chair)?: A Little Help needed to walk in hospital room?: A Little Help needed climbing 3-5 steps with a railing? : A Lot 6 Click Score: 19    End of Session Equipment Utilized During Treatment: Gait belt Activity Tolerance: Patient tolerated treatment well Patient left: in chair;with call bell/phone within reach;with chair alarm set   PT Visit Diagnosis: Unsteadiness  on feet (R26.81);Muscle weakness (generalized) (M62.81)     Time: 0277-4128 PT Time Calculation (min) (ACUTE ONLY): 25 min  Charges:  $Gait Training: 8-22 mins $Therapeutic Exercise: 8-22 mins                      Theresa Landry, PT Pager [phone]    Theresa Landry 07/16/2020, 9:29 AM

## 2020-07-16 NOTE — Discharge Summary (Addendum)
Name: Theresa Landry MRN: [id number] DOB: 01-06-1933 86 y.o. PCP: Loyal Jacobson, MD  Date of Admission: 07/09/2020  4:42 PM Date of Discharge: 07/17/2020 Attending Physician: Tyson Alias, *  Discharge Diagnosis: 1. Expressive aphasia 2/2 multiple, acute left MCA and left caudate body infarcts  Discharge Medications: Allergies as of 07/17/2020   No Known Allergies     Medication List    STOP taking these medications   diltiazem 240 MG 24 hr capsule Commonly known as: CARDIZEM CD     TAKE these medications   albuterol 108 (90 Base) MCG/ACT inhaler Commonly known as: VENTOLIN HFA Inhale 2 puffs into the lungs every 6 (six) hours as needed.   amLODipine 5 MG tablet Commonly known as: NORVASC Take 1 tablet (5 mg total) by mouth daily.   aspirin 81 MG EC tablet Take 1 tablet (81 mg total) by mouth daily. Swallow whole.   atorvastatin 80 MG tablet Commonly known as: LIPITOR Take 1 tablet (80 mg total) by mouth daily.   clopidogrel 75 MG tablet Commonly known as: Plavix Take 1 tablet (75 mg total) by mouth daily for 15 days. Only take until 8/24, stop taking after this date.   donepezil 10 MG tablet Commonly known as: ARICEPT Take 10 mg by mouth daily.   escitalopram 20 MG tablet Commonly known as: LEXAPRO Take 20 mg by mouth daily.   fluticasone 50 MCG/ACT nasal spray Commonly known as: FLONASE Place 2 sprays into both nostrils daily.   levothyroxine 25 MCG tablet Commonly known as: SYNTHROID Take 25 mcg by mouth every morning.   meclizine 25 MG tablet Commonly known as: ANTIVERT Take 1 tablet (25 mg total) by mouth 3 (three) times daily as needed for dizziness.   montelukast 10 MG tablet Commonly known as: SINGULAIR Take 10 mg by mouth daily.   pantoprazole 20 MG tablet Commonly known as: PROTONIX Take 20 mg by mouth daily.   Symbicort 160-4.5 MCG/ACT inhaler Generic drug: budesonide-formoterol Inhale 2 puffs into the lungs 2 (two)  times daily.       Disposition and follow-up:   Theresa Landry was discharged from [facility] in Stable condition.  At the hospital follow up visit please address:  1. Expressive aphasia 2/2 multiple, acute left MCA and left caudate body infarcts:  MRI did show Multiple small areas of acute infarct in the left MCA distribution. Test for any lingering issues with deficits in strength, gait, expression, or return of neglect. Interrogate the Loop recorder and address any concerns. She should stop taking Plavix on 8/24 and only take aspirin after that time.  2.  Labs / imaging needed at time of follow-up: BMP, Loop Recorder Interrogation   3.  Pending labs/ test needing follow-up: none  Follow-up Appointments:  Contact information for follow-up providers    Coletta Memos, MD. Go on 07/10/2020.   Specialty: Neurosurgery Contact information: [address] [place] [address] [phone]        [facility] [employer] [facility] Follow up.   Specialty: Cardiology Why: 07/19/2020 @ 12:30PM, wound check visit (heart monitor) Contact information: [address], [address] [place] [address] [phone]           Contact information for after-discharge care    Destination    [facility] SNF/ALF .   Service: Skilled Nursing Contact information: [address] [place] [address] [phone]  Hospital Course by problem list: 1. Expressive aphasia 2/2 multiple, acute left MCA and left caudate body infarcts: Presented with expressive aphasia without dysarthria, hemi-neglect, and right sided weakness . Had mild nausea, dizziness, and confusion. Worked with PT/OT/SLP and improved over the next 2 days to return to baseline. She was recommended for SNF placement to increase strength. Was approved for placement at [facility]. She was placed on dual antiplatelet  therapy, she should stop taking Plavix after 8/24 and only take the aspirin after that date.  Discharge Vitals:   BP 138/65 (BP Location: Left Arm)   Pulse (!) 54   Temp 97.8 F (36.6 C) (Oral)   Resp 18   Ht 5' (1.524 m)   Wt 69 kg   SpO2 97%   BMI 29.71 kg/m   Pertinent Labs, Studies, and Procedures:  BMP Latest Ref Rng & Units 07/12/2020 07/11/2020 07/10/2020  Glucose 70 - 99 mg/dL 518(Z) 358(I) 518(F)  BUN 8 - 23 mg/dL 13 8 8   Creatinine 0.44 - 1.00 mg/dL 8.42 1.03 1.28  Sodium 135 - 145 mmol/L 136 137 134(L)  Potassium 3.5 - 5.1 mmol/L 3.6 4.0 3.4(L)  Chloride 98 - 111 mmol/L 103 104 101  CO2 22 - 32 mmol/L 23 19(L) 23  Calcium 8.9 - 10.3 mg/dL 9.5 9.3 9.3   CBC Latest Ref Rng & Units 07/12/2020 07/11/2020 07/10/2020  WBC 4.0 - 10.5 K/uL 10.7(H) 10.4 11.1(H)  Hemoglobin 12.0 - 15.0 g/dL 11.8 86.7 11.5(L)  Hematocrit 36 - 46 % 38.6 37.4 35.2(L)  Platelets 150 - 400 K/uL 222 211 222    CT Head/Neck/cerebral profusion: No acute intracranial hemorrhage or evidence of acute infarction. ASPECT score is 10. Perfusion imaging demonstrates no evidence of core infarction or penumbra. No large vessel occlusion. Diminished opacification of a vessel visually contiguous with a left M2 MCA within the sylvian fissure is favored to be venous particularly given perfusion findings.Calcified plaque at the ICA origins causing less than 50% stenosis.   DG Chest: Consolidative opacity in the periphery of the right lower lobe centered upon several thickened airways, suspicious for pneumonia or aspiration.   DG Abdomin: No definite metallic foreign body identified on this study. Excreted contrast is noted in the collecting system and urinary bladder   MR Brain WO Contrast: Multiple small areas of acute infarct in the left MCA distribution. This involves the left frontal lobe and portions the left parietal lobe. There is acute infarct in the left caudate body. Small area of hemorrhage in the left parietal  cortex which may be recent however is not hyperdense on CT. This is likely due to the small volume of blood.   EEG: Showed moderate diffuse encephalopathy, nonspecific to etiology.   Echo: Ejection fraction 60-65%, no wall motion abnormalities, no structural abnormalities.   Loop recorder placed on 8/4.   Discharge Instructions: Discharge Instructions    Diet - low sodium heart healthy   Complete by: As directed    Discharge instructions   Complete by: As directed    Dear Theresa Landry  You came to [place] with difficulty speaking. We have determined this was caused by a stroke. Here are our recommendations for you at discharge:  Please take aspirin and clopidogrel 75mg  daily for 4 weeks and then take aspirin alone Please start atorvastatin 80mg  daily  Thank you for choosing [facility]   Increase activity slowly   Complete by: As directed       Signed: Lauro Franklin, MD  07/17/2020, 11:06 AM   Pager: [phone]

## 2020-07-16 NOTE — Care Management Important Message (Signed)
Important Message  Patient Details  Name: Erika Slaby MRN: [id number] Date of Birth: September 12, 1933   Medicare Important Message Given:  Yes Patient left Prior to IM delivery.  IM mailed to the patient address.     Caddie Randle 07/16/2020, 3:38 PM

## 2020-07-16 NOTE — Plan of Care (Signed)

## 2020-07-17 NOTE — Progress Notes (Signed)
Attempted report to [employer] x 2, no answer

## 2020-07-17 NOTE — Plan of Care (Signed)
Adequate for discharge.

## 2020-07-17 NOTE — Progress Notes (Signed)
Report given to Alinda Money at [facility]

## 2020-07-17 NOTE — TOC Transition Note (Signed)
Transition of Care [facility]) - CM/SW Discharge Note   Patient Details  Name: Theresa Landry MRN: [id number] Date of Birth: [birth year]  Transition of Care [facility]) CM/SW Contact:  Baldemar Lenis, LCSW Phone Number: 07/17/2020, 11:57 AM   Clinical Narrative:   Nurse to call report to Alinda Money at [phone], [place] Room 304.   Transport set for 2:00 PM.    Final next level of care: Skilled Nursing Facility Barriers to Discharge: Barriers Resolved   Patient Goals and CMS Choice Patient states their goals for this hospitalization and ongoing recovery are:: patient unable to participate in goal setting due to disorientation CMS Medicare.gov Compare Post Acute Care list provided to:: Patient Represenative (must comment) Choice offered to / list presented to : [facility] POA / Guardian  Discharge Placement              Patient [facility] bed at: [facility] Patient to be transferred to facility by: [facility] Name of family member notified: Chase Caller Patient and family notified of of transfer: 07/17/20  Discharge Plan and Services     Post Acute Care Choice: Skilled Nursing Facility                               Social Determinants of Health (SDOH) Interventions     Readmission Risk Interventions No flowsheet data found.

## 2020-07-17 NOTE — Progress Notes (Signed)
   Subjective: Interviewed patient at bedside.  She reports that she's doing well, no overnight issues. She is ready for discharge.  Objective:  Vital signs in last 24 hours: Vitals:   07/16/20 2149 07/16/20 2319 07/17/20 0324 07/17/20 0738  BP:  (!) 144/69 139/70 138/65  Pulse:  65 66 (!) 54  Resp:  17 18 18   Temp:  98 F (36.7 C) 98.3 F (36.8 C) 97.8 F (36.6 C)  TempSrc:  Oral Oral Oral  SpO2: 97% 99% 99% 97%  Weight:   69 kg   Height:       Physical Exam Vitals and nursing note reviewed.  Constitutional:      General: She is not in acute distress.    Appearance: Normal appearance. She is not ill-appearing or toxic-appearing.  HENT:     Head: Normocephalic and atraumatic.  Cardiovascular:     Rate and Rhythm: Normal rate and regular rhythm.     Pulses: Normal pulses.          Radial pulses are 2+ on the right side and 2+ on the left side.       Dorsalis pedis pulses are 2+ on the right side and 2+ on the left side.     Heart sounds: Normal heart sounds, S1 normal and S2 normal. No murmur heard.   Pulmonary:     Effort: Pulmonary effort is normal. No respiratory distress.     Breath sounds: Normal breath sounds. No wheezing.  Musculoskeletal:     Right lower leg: No edema.     Left lower leg: No edema.  Neurological:     Mental Status: She is alert. Mental status is at baseline.  Psychiatric:        Mood and Affect: Mood normal.        Behavior: Behavior normal.        Thought Content: Thought content normal.      Assessment/Plan:  Principal Problem:   CVA (cerebral vascular accident) (HCC) Active Problems:   Essential hypertension   Cerebral hemorrhage (HCC)   Aphasia   Theresa Landry is a 86 y.o. with PMH of HTN, HLD admit for acute L MCA stroke.   Acute L MCA stroke On admission found to have hemi-neglect and expressive aphasia. MR showing left MCA infarcts w/ small hemorrhage. Currently on DAPT. Neurological deficitscontinues to improve.  Seroquel was continued yesterday because she was unable to leave however, it will be stopped today when she leaves.Discharge to [facility] today. -Discharge to [facility]  -DAPT for 3 weeks until 8/24 then aspirin alone -Continueatorvastatin 80 mg daily -Stop theseroquel 25mg  at discharge   Prior to Admission Living Arrangement: Home Anticipated Discharge Location: SNF Barriers to Discharge: Paperwork Dispo: Anticipated discharge today.   9/24, MD 07/17/2020, 11:09 AM Pager: [phone] After 5pm on weekdays and 1pm on weekends: On Call pager [phone]

## 2020-07-17 NOTE — Progress Notes (Signed)
Attempted report to [facility], no answer

## 2020-07-19 ENCOUNTER — Ambulatory Visit: Payer: Medicare Other

## 2020-07-24 ENCOUNTER — Ambulatory Visit (INDEPENDENT_AMBULATORY_CARE_PROVIDER_SITE_OTHER): Payer: Medicare Other | Admitting: Emergency Medicine

## 2020-07-24 ENCOUNTER — Other Ambulatory Visit: Payer: Self-pay

## 2020-07-24 DIAGNOSIS — I639 Cerebral infarction, unspecified: Secondary | ICD-10-CM

## 2020-07-24 LAB — CUP PACEART INCLINIC DEVICE CHECK
Date Time Interrogation Session: 20210817171544
Implantable Pulse Generator Implant Date: 20210804

## 2020-07-24 NOTE — Progress Notes (Signed)
ILR wound check in clinic. Steri strips removed prior to appointment. Wound well healed. R-waves 0.24 mV. Home monitor not transmitting nightly. Patient resident at [facility] at  [place]. Patient reports monitor is not plugged in. Order sent to facility to place monitor at bedside and insure plugged in. No episodes. Questions answered.

## 2020-07-24 NOTE — Patient Instructions (Signed)
Call [facility] device clinic if you have any swelling, drainage, fever or chills. Locate and plug in remote monitor within 3-5 feet from bed. Call [facility] if you can not locate the [employer] remote monitor. [facility] Device Clinic: [phone]

## 2020-07-25 ENCOUNTER — Telehealth: Payer: Self-pay

## 2020-07-25 NOTE — Telephone Encounter (Signed)
ILR report reviewed and no acute findings noted. Attempted to call Theresa Landry to update, no answer.  Unable to leave VM, phone rings continuously.

## 2020-07-25 NOTE — Telephone Encounter (Signed)
Marcelino Duster from [facility] calling to let us know the pt monitor is plugged in. She wanted to know how it look and if it is okay if the pt can return home. If there any concerns on the readings? Marcelino Duster phone number is [phone] ask for Alinda Money.

## 2020-07-25 NOTE — Telephone Encounter (Signed)
Spoke to [place] at facility and made aware no acute findings noted.

## 2020-08-20 ENCOUNTER — Ambulatory Visit (INDEPENDENT_AMBULATORY_CARE_PROVIDER_SITE_OTHER): Payer: Medicare Other | Admitting: *Deleted

## 2020-08-20 DIAGNOSIS — I639 Cerebral infarction, unspecified: Secondary | ICD-10-CM

## 2020-08-20 LAB — CUP PACEART REMOTE DEVICE CHECK
Date Time Interrogation Session: 20210912040952
Implantable Pulse Generator Implant Date: 20210804

## 2020-08-22 NOTE — Progress Notes (Signed)
Carelink Summary Report / Loop Recorder 

## 2020-09-22 LAB — CUP PACEART REMOTE DEVICE CHECK
Date Time Interrogation Session: 20211015040809
Implantable Pulse Generator Implant Date: 20210804

## 2020-09-24 ENCOUNTER — Ambulatory Visit (INDEPENDENT_AMBULATORY_CARE_PROVIDER_SITE_OTHER): Payer: Medicare Other

## 2020-09-24 DIAGNOSIS — I639 Cerebral infarction, unspecified: Secondary | ICD-10-CM

## 2020-09-28 NOTE — Progress Notes (Signed)
Carelink Summary Report / Loop Recorder 

## 2020-10-24 ENCOUNTER — Ambulatory Visit (INDEPENDENT_AMBULATORY_CARE_PROVIDER_SITE_OTHER): Payer: Medicare Other

## 2020-10-24 DIAGNOSIS — I639 Cerebral infarction, unspecified: Secondary | ICD-10-CM

## 2020-10-24 LAB — CUP PACEART REMOTE DEVICE CHECK
Date Time Interrogation Session: 20211117040652
Implantable Pulse Generator Implant Date: 20210804

## 2020-10-25 NOTE — Progress Notes (Signed)
Carelink Summary Report / Loop Recorder 

## 2020-11-26 ENCOUNTER — Ambulatory Visit (INDEPENDENT_AMBULATORY_CARE_PROVIDER_SITE_OTHER): Payer: Medicare Other

## 2020-11-26 DIAGNOSIS — I639 Cerebral infarction, unspecified: Secondary | ICD-10-CM | POA: Diagnosis not present

## 2020-11-26 LAB — CUP PACEART REMOTE DEVICE CHECK
Date Time Interrogation Session: 20211220040746
Implantable Pulse Generator Implant Date: 20210804

## 2020-12-10 NOTE — Progress Notes (Signed)
Carelink Summary Report / Loop Recorder 

## 2020-12-27 ENCOUNTER — Ambulatory Visit (INDEPENDENT_AMBULATORY_CARE_PROVIDER_SITE_OTHER): Payer: Medicare Other

## 2020-12-27 DIAGNOSIS — I639 Cerebral infarction, unspecified: Secondary | ICD-10-CM | POA: Diagnosis not present

## 2020-12-29 LAB — CUP PACEART REMOTE DEVICE CHECK
Date Time Interrogation Session: 20220122040714
Implantable Pulse Generator Implant Date: 20210804

## 2021-01-09 NOTE — Progress Notes (Signed)
Carelink Summary Report / Loop Recorder 

## 2021-01-28 ENCOUNTER — Ambulatory Visit (INDEPENDENT_AMBULATORY_CARE_PROVIDER_SITE_OTHER): Payer: Medicare Other

## 2021-01-28 DIAGNOSIS — I639 Cerebral infarction, unspecified: Secondary | ICD-10-CM | POA: Diagnosis not present

## 2021-01-31 LAB — CUP PACEART REMOTE DEVICE CHECK
Date Time Interrogation Session: 20220224040631
Implantable Pulse Generator Implant Date: 20210804

## 2021-02-05 NOTE — Progress Notes (Signed)
Carelink Summary Report / Loop Recorder 

## 2021-02-28 ENCOUNTER — Ambulatory Visit (INDEPENDENT_AMBULATORY_CARE_PROVIDER_SITE_OTHER): Payer: Medicare Other

## 2021-02-28 DIAGNOSIS — I639 Cerebral infarction, unspecified: Secondary | ICD-10-CM | POA: Diagnosis not present

## 2021-03-05 LAB — CUP PACEART REMOTE DEVICE CHECK
Date Time Interrogation Session: 20220329041009
Implantable Pulse Generator Implant Date: 20210804

## 2021-03-12 NOTE — Progress Notes (Signed)
Carelink Summary Report / Loop Recorder 

## 2021-04-08 ENCOUNTER — Emergency Department (HOSPITAL_COMMUNITY): Payer: Medicare Other

## 2021-04-08 ENCOUNTER — Other Ambulatory Visit: Payer: Self-pay

## 2021-04-08 ENCOUNTER — Emergency Department (HOSPITAL_COMMUNITY)
Admission: EM | Admit: 2021-04-08 | Discharge: 2021-04-08 | Disposition: A | Discharge status: Home / Self Care | Payer: Medicare Other | Attending: Emergency Medicine | Admitting: Emergency Medicine

## 2021-04-08 ENCOUNTER — Ambulatory Visit (INDEPENDENT_AMBULATORY_CARE_PROVIDER_SITE_OTHER): Payer: Medicare Other

## 2021-04-08 DIAGNOSIS — R112 Nausea with vomiting, unspecified: Secondary | ICD-10-CM

## 2021-04-08 DIAGNOSIS — I10 Essential (primary) hypertension: Secondary | ICD-10-CM | POA: Diagnosis not present

## 2021-04-08 DIAGNOSIS — K5730 Diverticulosis of large intestine without perforation or abscess without bleeding: Secondary | ICD-10-CM | POA: Insufficient documentation

## 2021-04-08 DIAGNOSIS — R9389 Abnormal findings on diagnostic imaging of other specified body structures: Secondary | ICD-10-CM | POA: Diagnosis not present

## 2021-04-08 DIAGNOSIS — Z20822 Contact with and (suspected) exposure to covid-19: Secondary | ICD-10-CM | POA: Insufficient documentation

## 2021-04-08 DIAGNOSIS — I639 Cerebral infarction, unspecified: Secondary | ICD-10-CM

## 2021-04-08 DIAGNOSIS — J45901 Unspecified asthma with (acute) exacerbation: Secondary | ICD-10-CM | POA: Insufficient documentation

## 2021-04-08 DIAGNOSIS — I700 Atherosclerosis of aorta: Secondary | ICD-10-CM | POA: Insufficient documentation

## 2021-04-08 DIAGNOSIS — Z7982 Long term (current) use of aspirin: Secondary | ICD-10-CM | POA: Diagnosis not present

## 2021-04-08 DIAGNOSIS — Z79899 Other long term (current) drug therapy: Secondary | ICD-10-CM | POA: Insufficient documentation

## 2021-04-08 DIAGNOSIS — Z87891 Personal history of nicotine dependence: Secondary | ICD-10-CM | POA: Diagnosis not present

## 2021-04-08 DIAGNOSIS — Z7951 Long term (current) use of inhaled steroids: Secondary | ICD-10-CM | POA: Diagnosis not present

## 2021-04-08 LAB — CBC WITH DIFFERENTIAL/PLATELET
Abs Immature Granulocytes: 0.03 10*3/uL (ref 0.00–0.07)
Basophils Absolute: 0.1 10*3/uL (ref 0.0–0.1)
Basophils Relative: 1 %
Eosinophils Absolute: 1 10*3/uL — ABNORMAL HIGH (ref 0.0–0.5)
Eosinophils Relative: 11 %
HCT: 37.7 % (ref 36.0–46.0)
Hemoglobin: 12.3 g/dL (ref 12.0–15.0)
Immature Granulocytes: 0 %
Lymphocytes Relative: 23 %
Lymphs Abs: 2 10*3/uL (ref 0.7–4.0)
MCH: 33.6 pg (ref 26.0–34.0)
MCHC: 32.6 g/dL (ref 30.0–36.0)
MCV: 103 fL — ABNORMAL HIGH (ref 80.0–100.0)
Monocytes Absolute: 0.5 10*3/uL (ref 0.1–1.0)
Monocytes Relative: 6 %
Neutro Abs: 5.2 10*3/uL (ref 1.7–7.7)
Neutrophils Relative %: 59 %
Platelets: 238 10*3/uL (ref 150–400)
RBC: 3.66 MIL/uL — ABNORMAL LOW (ref 3.87–5.11)
RDW: 13.4 % (ref 11.5–15.5)
WBC: 8.8 10*3/uL (ref 4.0–10.5)
nRBC: 0 % (ref 0.0–0.2)

## 2021-04-08 LAB — COMPREHENSIVE METABOLIC PANEL
ALT: 14 U/L (ref 0–44)
AST: 22 U/L (ref 15–41)
Albumin: 3.9 g/dL (ref 3.5–5.0)
Alkaline Phosphatase: 59 U/L (ref 38–126)
Anion gap: 9 (ref 5–15)
BUN: 16 mg/dL (ref 8–23)
CO2: 28 mmol/L (ref 22–32)
Calcium: 9.8 mg/dL (ref 8.9–10.3)
Chloride: 100 mmol/L (ref 98–111)
Creatinine, Ser: 0.86 mg/dL (ref 0.44–1.00)
GFR, Estimated: >60 mL/min (ref >60)
Glucose, Bld: 154 mg/dL — ABNORMAL HIGH (ref 70–99)
Potassium: 3.6 mmol/L (ref 3.5–5.1)
Sodium: 137 mmol/L (ref 135–145)
Total Bilirubin: 0.7 mg/dL (ref 0.3–1.2)
Total Protein: 7.9 g/dL (ref 6.5–8.1)

## 2021-04-08 LAB — URINALYSIS, ROUTINE W REFLEX MICROSCOPIC
Bilirubin Urine: NEGATIVE
Glucose, UA: NEGATIVE mg/dL
Hgb urine dipstick: NEGATIVE
Ketones, ur: 20 mg/dL — AB
Leukocytes,Ua: NEGATIVE
Nitrite: NEGATIVE
Protein, ur: NEGATIVE mg/dL
Specific Gravity, Urine: 1.017 (ref 1.005–1.030)
pH: 8 (ref 5.0–8.0)

## 2021-04-08 LAB — RESP PANEL BY RT-PCR (FLU A&B, COVID) ARPGX2
Influenza A by PCR: NEGATIVE
Influenza B by PCR: NEGATIVE
SARS Coronavirus 2 by RT PCR: NEGATIVE

## 2021-04-08 LAB — TROPONIN I (HIGH SENSITIVITY)
Troponin I (High Sensitivity): 3 ng/L (ref <18)
Troponin I (High Sensitivity): 2 ng/L (ref <18)

## 2021-04-08 LAB — LIPASE, BLOOD: Lipase: 21 U/L (ref 11–51)

## 2021-04-08 MED ORDER — IOHEXOL 300 MG/ML  SOLN
100.0000 mL | Freq: Once | INTRAMUSCULAR | Status: AC | PRN
  Administered 2021-04-08: 80 mL via INTRAVENOUS

## 2021-04-08 NOTE — ED Notes (Signed)
Patient is very hard of hearing.  She ambulated to the BR with assist but needed moderate amount of help.  Alert and answers questions appropriately.  Small amount of yellowish emesis not in emesis bag

## 2021-04-08 NOTE — ED Notes (Signed)
Patient dressed in a wheelchair to BR- tolerated well.  Patient appears to have unsteady gait when assisting to BR but tolerates fairly well

## 2021-04-08 NOTE — ED Provider Notes (Signed)
Patient signed out to me from previous provider, please see his note for complete H&P.  But this is an 87 year old female who was seen earlier today for nausea and vomiting after eating lunch.  Symptoms shortly abated once she arrived to the ER.  Given her age, an abdominal pelvis CT scan was obtained.  Unfortunately, CT scan did not show any acute abnormalities.  They will however diffuse thickening of the endometrium and radiologist recommend a follow-up with an outpatient pelvic ultrasound is recommended as this finding may indicate underlying endometrial carcinoma.  I discussed this finding with patient.  At this time she is stable for discharge  BP (!) 159/66 (BP Location: Right Arm)   Pulse (!) 54   Temp 97.9 F (36.6 C) (Oral)   Resp 16   Ht 5' (1.524 m)   Wt 62.6 kg   SpO2 96%   BMI 26.95 kg/m   Results for orders placed or performed during the hospital encounter of 04/08/21  CBC with Differential  Result Value Ref Range   WBC 8.8 4.0 - 10.5 K/uL   RBC 3.66 (L) 3.87 - 5.11 MIL/uL   Hemoglobin 12.3 12.0 - 15.0 g/dL   HCT 85.8 85.0 - 27.7 %   MCV 103.0 (H) 80.0 - 100.0 fL   MCH 33.6 26.0 - 34.0 pg   MCHC 32.6 30.0 - 36.0 g/dL   RDW 41.2 87.8 - 67.6 %   Platelets 238 150 - 400 K/uL   nRBC 0.0 0.0 - 0.2 %   Neutrophils Relative % 59 %   Neutro Abs 5.2 1.7 - 7.7 K/uL   Lymphocytes Relative 23 %   Lymphs Abs 2.0 0.7 - 4.0 K/uL   Monocytes Relative 6 %   Monocytes Absolute 0.5 0.1 - 1.0 K/uL   Eosinophils Relative 11 %   Eosinophils Absolute 1.0 (H) 0.0 - 0.5 K/uL   Basophils Relative 1 %   Basophils Absolute 0.1 0.0 - 0.1 K/uL   Immature Granulocytes 0 %   Abs Immature Granulocytes 0.03 0.00 - 0.07 K/uL  Comprehensive metabolic panel  Result Value Ref Range   Sodium 137 135 - 145 mmol/L   Potassium 3.6 3.5 - 5.1 mmol/L   Chloride 100 98 - 111 mmol/L   CO2 28 22 - 32 mmol/L   Glucose, Bld 154 (H) 70 - 99 mg/dL   BUN 16 8 - 23 mg/dL   Creatinine, Ser 7.20 0.44 - 1.00  mg/dL   Calcium 9.8 8.9 - 94.7 mg/dL   Total Protein 7.9 6.5 - 8.1 g/dL   Albumin 3.9 3.5 - 5.0 g/dL   AST 22 15 - 41 U/L   ALT 14 0 - 44 U/L   Alkaline Phosphatase 59 38 - 126 U/L   Total Bilirubin 0.7 0.3 - 1.2 mg/dL   GFR, Estimated >09 >62 mL/min   Anion gap 9 5 - 15  Lipase, blood  Result Value Ref Range   Lipase 21 11 - 51 U/L  Urinalysis, Routine w reflex microscopic Urine, Clean Catch  Result Value Ref Range   Color, Urine YELLOW YELLOW   APPearance CLEAR CLEAR   Specific Gravity, Urine 1.017 1.005 - 1.030   pH 8.0 5.0 - 8.0   Glucose, UA NEGATIVE NEGATIVE mg/dL   Hgb urine dipstick NEGATIVE NEGATIVE   Bilirubin Urine NEGATIVE NEGATIVE   Ketones, ur 20 (A) NEGATIVE mg/dL   Protein, ur NEGATIVE NEGATIVE mg/dL   Nitrite NEGATIVE NEGATIVE   Leukocytes,Ua NEGATIVE NEGATIVE  Troponin  I (High Sensitivity)  Result Value Ref Range   Troponin I (High Sensitivity) 3 <18 ng/L  Troponin I (High Sensitivity)  Result Value Ref Range   Troponin I (High Sensitivity) 2 <18 ng/L   CT ABDOMEN PELVIS W CONTRAST  Result Date: 04/08/2021 CLINICAL DATA:  Bowel obstruction suspected. EXAM: CT ABDOMEN AND PELVIS WITH CONTRAST TECHNIQUE: Multidetector CT imaging of the abdomen and pelvis was performed using the standard protocol following bolus administration of intravenous contrast. CONTRAST:  70mL OMNIPAQUE IOHEXOL 300 MG/ML  SOLN COMPARISON:  None. FINDINGS: Lower chest: There are areas of bronchiectasis involving the bilateral lower lobes.The heart size is normal. Hepatobiliary: The liver is normal. Normal gallbladder.There is no biliary ductal dilation. Pancreas: Normal contours without ductal dilatation. No peripancreatic fluid collection. Spleen: Unremarkable. Adrenals/Urinary Tract: --Adrenal glands: Unremarkable. --Right kidney/ureter: No hydronephrosis or radiopaque kidney stones. --Left kidney/ureter: No hydronephrosis or radiopaque kidney stones. --Urinary bladder: Unremarkable.  Stomach/Bowel: --Stomach/Duodenum: No hiatal hernia or other gastric abnormality. Normal duodenal course and caliber. --Small bowel: Unremarkable. --Colon: There is a large amount of stool in the rectum. There is sigmoid diverticulosis without CT evidence for diverticulitis. --Appendix: Not visualized. No right lower quadrant inflammation or free fluid. Vascular/Lymphatic: Atherosclerotic calcification is present within the non-aneurysmal abdominal aorta, without hemodynamically significant stenosis. --No retroperitoneal lymphadenopathy. --No mesenteric lymphadenopathy. --No pelvic or inguinal lymphadenopathy. Reproductive: There is diffuse thickening of the endometrium. Other: No ascites or free air. The abdominal wall is normal. Musculoskeletal. No acute displaced fractures. IMPRESSION: 1. No acute abnormality detected. 2. Diffuse thickening of the endometrium. Follow-up with an outpatient pelvic ultrasound is recommended as findings may indicate underlying endometrial carcinoma. 3. Severe sigmoid diverticulosis without CT evidence for diverticulitis. Aortic Atherosclerosis (ICD10-I70.0). Electronically Signed   By: Katherine Mantle M.D.   On: 04/08/2021 16:41   DG Abdomen Acute W/Chest  Result Date: 04/08/2021 CLINICAL DATA:  New onset nausea vomiting of treating this morning EXAM: DG ABDOMEN ACUTE WITH 1 VIEW CHEST COMPARISON:  Chest and abdominal radiograph 07/09/2020, chest CT 12/13/2020 FINDINGS: Unchanged cardiomediastinal silhouette. Loop recorder overlies the left lower chest. There is basilar peripheral reticulations, likely scarring, similar to prior exam. There is no new focal airspace disease. No large effusion or visible pneumothorax. There is no evidence of subdiaphragmatic free air. Nonobstructive bowel gas pattern. Degenerative changes of the spine. No acute osseous abnormality. IMPRESSION: No evidence of bowel obstruction. No new focal airspace disease. Electronically Signed   By: Caprice Renshaw    On: 04/08/2021 14:34      Fayrene Helper, PA-C 04/08/21 Montey Hora, MD 04/09/21 [phone]

## 2021-04-08 NOTE — ED Provider Notes (Signed)
[facility]-EMERGENCY DEPT Provider Note   CSN: [id number] Arrival date & time: 04/08/21  1229     History Chief Complaint  Patient presents with  . Emesis    Theresa Landry is a 87 y.o. female history of asthma, arthritis, CVA, GERD, hypertension  Patient arrives to the ED for evaluation of nausea and vomiting, patient reports just prior to arrival she was eating lunch when she developed nausea followed by nonbloody/nonbilious emesis this occurred several times before resolving.  Patient reports that this has not happened to her for.  She reports that she is beginning to feel better since arriving to the ED.  Denies fever/chills, chest pain/shortness of breath, cough, abdominal pain, dysuria/hematuria, diarrhea or any additional concerns.  HPI     Past Medical History:  Diagnosis Date  . Arthritis   . Asthma     Patient Active Problem List   Diagnosis Date Noted  . Cerebral hemorrhage (HCC) 07/10/2020  . Aphasia   . CVA (cerebral vascular accident) (HCC) 07/09/2020  . Acute asthma exacerbation 05/21/2019  . Adjustment disorder with anxious mood 02/25/2018  . Essential hypertension 02/25/2018  . GERD (gastroesophageal reflux disease) 02/25/2018    Past Surgical History:  Procedure Laterality Date  . LOOP RECORDER INSERTION N/A 07/11/2020   Procedure: LOOP RECORDER INSERTION;  Surgeon: Regan Lemming, MD;  Location: [facility] INVASIVE CV LAB;  Service: Cardiovascular;  Laterality: N/A;  . TONSILLECTOMY       OB History   No obstetric history on file.     Family History  Problem Relation Age of Onset  . Heart disease Mother   . Stroke Father     Social History   Tobacco Use  . Smoking status: Former Games developer  . Smokeless tobacco: Never Used  Substance Use Topics  . Alcohol use: Yes  . Drug use: Never    Home Medications Prior to Admission medications   Medication Sig Start Date End Date Taking? Authorizing Provider  albuterol  (VENTOLIN HFA) 108 (90 Base) MCG/ACT inhaler Inhale 2 puffs into the lungs every 6 (six) hours as needed. 06/09/20   [provider]  amLODipine (NORVASC) 5 MG tablet Take 1 tablet (5 mg total) by mouth daily. 07/16/20   Lauro Franklin, MD  aspirin EC 81 MG EC tablet Take 1 tablet (81 mg total) by mouth daily. Swallow whole. 07/16/20   Lauro Franklin, MD  atorvastatin (LIPITOR) 80 MG tablet Take 1 tablet (80 mg total) by mouth daily. 07/16/20   Lauro Franklin, MD  donepezil (ARICEPT) 10 MG tablet Take 10 mg by mouth daily. 04/26/20   [provider]  escitalopram (LEXAPRO) 20 MG tablet Take 20 mg by mouth daily. 04/26/20   [provider]  fluticasone (FLONASE) 50 MCG/ACT nasal spray Place 2 sprays into both nostrils daily. 04/26/20   [provider]  levothyroxine (SYNTHROID) 25 MCG tablet Take 25 mcg by mouth every morning. 01/16/20   [provider]  meclizine (ANTIVERT) 25 MG tablet Take 1 tablet (25 mg total) by mouth 3 (three) times daily as needed for dizziness. 05/22/19   Dartha Lodge, PA-C  montelukast (SINGULAIR) 10 MG tablet Take 10 mg by mouth daily. 01/16/20   [provider]  pantoprazole (PROTONIX) 20 MG tablet Take 20 mg by mouth daily. 01/17/20   [provider]  SYMBICORT 160-4.5 MCG/ACT inhaler Inhale 2 puffs into the lungs 2 (two) times daily. 03/05/20   [provider]  Allergies    Patient has no known allergies.  Review of Systems   Review of Systems Ten systems are reviewed and are negative for acute change except as noted in the HPI  Physical Exam Updated Vital Signs BP (!) 175/71 (BP Location: Right Arm)   Pulse 60   Temp 97.9 F (36.6 C) (Oral)   Resp 13   Ht 5' (1.524 m)   Wt 62.6 kg   SpO2 94%   BMI 26.95 kg/m   Physical Exam Constitutional:      General: She is not in acute distress.    Appearance: Normal appearance. She is well-developed. She is not ill-appearing or  diaphoretic.  HENT:     Head: Normocephalic and atraumatic.  Eyes:     General: Vision grossly intact. Gaze aligned appropriately.     Pupils: Pupils are equal, round, and reactive to light.  Neck:     Trachea: Trachea and phonation normal.  Pulmonary:     Effort: Pulmonary effort is normal. No respiratory distress.  Abdominal:     General: There is no distension.     Palpations: Abdomen is soft.     Tenderness: There is no abdominal tenderness. There is no guarding or rebound.  Musculoskeletal:        General: Normal range of motion.     Cervical back: Normal range of motion.  Skin:    General: Skin is warm and dry.  Neurological:     Mental Status: She is alert.     GCS: GCS eye subscore is 4. GCS verbal subscore is 5. GCS motor subscore is 6.     Comments: Speech is clear and goal oriented, follows commands Major Cranial nerves without deficit, no facial droop Moves extremities without ataxia, coordination intact  Psychiatric:        Behavior: Behavior normal.     ED Results / Procedures / Treatments   Labs (all labs ordered are listed, but only abnormal results are displayed) Labs Reviewed - No data to display  EKG None  Radiology No results found.  Procedures Procedures   Medications Ordered in ED Medications - No data to display  ED Course  I have reviewed the triage vital signs and the nursing notes.  Pertinent labs & imaging results that were available during my care of the patient were reviewed by me and considered in my medical decision making (see chart for details).    MDM Rules/Calculators/A&P                          Additional history obtained from: 1. Nursing notes from this visit. 2. Review of electronic medical records.  No pertinent recent ED visits. ------------- 87 year old female arrives for evaluation of nausea and vomiting after eating lunch today.  Symptoms are improving.  She reports that she was feeling well prior to this event  denies any recent illness, fever or chills.  She denies any chest pain or shortness of breath.  Denies any abdominal pain or urinary symptoms.  Patient is well-appearing on exam no acute distress vital signs are stable, she has no abdominal tenderness on exam.  Will obtain abdominal pain labs as well as a CT abdomen pelvis for further evaluation of vomiting in an elderly female.  Additionally I have added an EKG and troponin to evaluate for atypical ACS but she has no chest pain or shortness of breath today - I ordered, reviewed and interpreted labs which include:  CBC without leukocytosis, anemia or thrombocytopenia. CMP shows no emergent electrolyte derangement, AKI, LFT elevations or gap. Lipase within normal limits. High-sensitivity troponin within normal limits. Urinalysis shows 20 ketones, no evidence for infection. COVID test pending Delta troponin pending  DG chest/acute abdomen:  IMPRESSION:  No evidence of bowel obstruction.    No new focal airspace disease.  ---------- Patient reassessed resting comfortably in bed no acute distress vital signs are stable.  Remains pain-free.  No recurrence of emesis.  Currently awaiting CT abdomen pelvis, delta troponin and COVID/influenza panel  Care handoff given to Fayrene Helper PA-C at shift change, plan of care is to follow-up on the studies, reassess.  Disposition per oncoming team.  Case was discussed with Dr. Estell Harpin during this visit.  Note: Portions of this report may have been transcribed using voice recognition software. Every effort was made to ensure accuracy; however, inadvertent computerized transcription errors may still be present. Final Clinical Impression(s) / ED Diagnoses Final diagnoses:  None    Rx / DC Orders ED Discharge Orders    None       Elizabeth Palau 04/08/21 1538    Bethann Berkshire, MD 04/09/21 [phone]

## 2021-04-08 NOTE — ED Triage Notes (Signed)
Arrives via EMS- patient was eating at a restaurant and became nauseated and has been vomiting large amount prior to arrival. Patient denies any type of body ache or pain.

## 2021-04-08 NOTE — Discharge Instructions (Signed)
You have been evaluated for your symptoms.  Fortunately no acute finding were noted on today's exam.  Your CT scan did show that your uterus lining is a bit thickened, and we recommend for you to discuss this with your primary care doctor and to obtain a pelvic ultrasound at some point in the near future to ensure this is not related to cancer.  Return if you have any concern

## 2021-04-08 NOTE — ED Notes (Signed)
Patient's friend, Ferne Reus has been notified regarding discharge and pick up

## 2021-04-08 NOTE — ED Notes (Signed)
Pt ambulated to an from bathroom. Pt O2 dropped to 90 at one point but remained around 93 for the most part.

## 2021-04-10 LAB — CUP PACEART REMOTE DEVICE CHECK
Date Time Interrogation Session: 20220430001843
Date Time Interrogation Session: 20220504155226
Implantable Pulse Generator Implant Date: 20210804
Implantable Pulse Generator Implant Date: 20210804

## 2021-04-29 NOTE — Progress Notes (Signed)
Carelink Summary Report / Loop Recorder 

## 2021-05-09 ENCOUNTER — Ambulatory Visit (INDEPENDENT_AMBULATORY_CARE_PROVIDER_SITE_OTHER): Payer: Medicare Other

## 2021-05-09 DIAGNOSIS — I639 Cerebral infarction, unspecified: Secondary | ICD-10-CM | POA: Diagnosis not present

## 2021-05-11 LAB — CUP PACEART REMOTE DEVICE CHECK
Date Time Interrogation Session: 20220603040804
Implantable Pulse Generator Implant Date: 20210804

## 2021-06-03 NOTE — Progress Notes (Signed)
Carelink Summary Report / Loop Recorder 

## 2021-06-11 ENCOUNTER — Ambulatory Visit (INDEPENDENT_AMBULATORY_CARE_PROVIDER_SITE_OTHER): Payer: Medicare Other

## 2021-06-11 DIAGNOSIS — I639 Cerebral infarction, unspecified: Secondary | ICD-10-CM | POA: Diagnosis not present

## 2021-06-12 LAB — CUP PACEART REMOTE DEVICE CHECK
Date Time Interrogation Session: 20220706040944
Implantable Pulse Generator Implant Date: 20210804

## 2021-07-02 NOTE — Progress Notes (Signed)
Carelink Summary Report / Loop Recorder 

## 2021-07-11 ENCOUNTER — Ambulatory Visit (INDEPENDENT_AMBULATORY_CARE_PROVIDER_SITE_OTHER): Payer: Medicare Other

## 2021-07-11 DIAGNOSIS — I639 Cerebral infarction, unspecified: Secondary | ICD-10-CM | POA: Diagnosis not present

## 2021-07-15 LAB — CUP PACEART REMOTE DEVICE CHECK
Date Time Interrogation Session: 20220808041057
Implantable Pulse Generator Implant Date: 20210804

## 2021-08-05 NOTE — Progress Notes (Signed)
Carelink Summary Report / Loop Recorder 

## 2021-08-19 ENCOUNTER — Ambulatory Visit (INDEPENDENT_AMBULATORY_CARE_PROVIDER_SITE_OTHER): Payer: Medicare Other

## 2021-08-19 DIAGNOSIS — I639 Cerebral infarction, unspecified: Secondary | ICD-10-CM | POA: Diagnosis not present

## 2021-08-19 LAB — CUP PACEART REMOTE DEVICE CHECK
Date Time Interrogation Session: 20220910040730
Implantable Pulse Generator Implant Date: 20210804

## 2021-08-27 NOTE — Progress Notes (Signed)
Carelink Summary Report / Loop Recorder 

## 2021-09-07 ENCOUNTER — Emergency Department (HOSPITAL_COMMUNITY): Payer: Medicare Other

## 2021-09-07 ENCOUNTER — Encounter (HOSPITAL_COMMUNITY): Payer: Self-pay | Admitting: Emergency Medicine

## 2021-09-07 ENCOUNTER — Other Ambulatory Visit: Payer: Self-pay

## 2021-09-07 ENCOUNTER — Emergency Department (HOSPITAL_COMMUNITY)
Admission: EM | Admit: 2021-09-07 | Discharge: 2021-09-07 | Disposition: A | Discharge status: Home / Self Care | Payer: Medicare Other | Attending: Emergency Medicine | Admitting: Emergency Medicine

## 2021-09-07 DIAGNOSIS — J45901 Unspecified asthma with (acute) exacerbation: Secondary | ICD-10-CM | POA: Diagnosis not present

## 2021-09-07 DIAGNOSIS — Z87891 Personal history of nicotine dependence: Secondary | ICD-10-CM | POA: Diagnosis not present

## 2021-09-07 DIAGNOSIS — Z79899 Other long term (current) drug therapy: Secondary | ICD-10-CM | POA: Insufficient documentation

## 2021-09-07 DIAGNOSIS — I10 Essential (primary) hypertension: Secondary | ICD-10-CM | POA: Insufficient documentation

## 2021-09-07 DIAGNOSIS — D72829 Elevated white blood cell count, unspecified: Secondary | ICD-10-CM | POA: Insufficient documentation

## 2021-09-07 DIAGNOSIS — Z7982 Long term (current) use of aspirin: Secondary | ICD-10-CM | POA: Diagnosis not present

## 2021-09-07 DIAGNOSIS — R0602 Shortness of breath: Secondary | ICD-10-CM | POA: Diagnosis present

## 2021-09-07 DIAGNOSIS — Z7951 Long term (current) use of inhaled steroids: Secondary | ICD-10-CM | POA: Insufficient documentation

## 2021-09-07 LAB — CBC WITH DIFFERENTIAL/PLATELET
Abs Immature Granulocytes: 0.04 10*3/uL (ref 0.00–0.07)
Basophils Absolute: 0.1 10*3/uL (ref 0.0–0.1)
Basophils Relative: 1 %
Eosinophils Absolute: 0.4 10*3/uL (ref 0.0–0.5)
Eosinophils Relative: 3 %
HCT: 32.8 % — ABNORMAL LOW (ref 36.0–46.0)
Hemoglobin: 10.9 g/dL — ABNORMAL LOW (ref 12.0–15.0)
Immature Granulocytes: 0 %
Lymphocytes Relative: 9 %
Lymphs Abs: 1.3 10*3/uL (ref 0.7–4.0)
MCH: 33 pg (ref 26.0–34.0)
MCHC: 33.2 g/dL (ref 30.0–36.0)
MCV: 99.4 fL (ref 80.0–100.0)
Monocytes Absolute: 0.8 10*3/uL (ref 0.1–1.0)
Monocytes Relative: 6 %
Neutro Abs: 11.1 10*3/uL — ABNORMAL HIGH (ref 1.7–7.7)
Neutrophils Relative %: 81 %
Platelets: 213 10*3/uL (ref 150–400)
RBC: 3.3 MIL/uL — ABNORMAL LOW (ref 3.87–5.11)
RDW: 14.3 % (ref 11.5–15.5)
WBC: 13.7 10*3/uL — ABNORMAL HIGH (ref 4.0–10.5)
nRBC: 0 % (ref 0.0–0.2)

## 2021-09-07 LAB — TROPONIN I (HIGH SENSITIVITY): Troponin I (High Sensitivity): 3 ng/L (ref <18)

## 2021-09-07 LAB — BASIC METABOLIC PANEL
Anion gap: 7 (ref 5–15)
BUN: 13 mg/dL (ref 8–23)
CO2: 25 mmol/L (ref 22–32)
Calcium: 9.7 mg/dL (ref 8.9–10.3)
Chloride: 103 mmol/L (ref 98–111)
Creatinine, Ser: 0.73 mg/dL (ref 0.44–1.00)
GFR, Estimated: >60 mL/min (ref >60)
Glucose, Bld: 131 mg/dL — ABNORMAL HIGH (ref 70–99)
Potassium: 3.8 mmol/L (ref 3.5–5.1)
Sodium: 135 mmol/L (ref 135–145)

## 2021-09-07 MED ORDER — ALBUTEROL SULFATE HFA 108 (90 BASE) MCG/ACT IN AERS
4.0000 | INHALATION_SPRAY | Freq: Once | RESPIRATORY_TRACT | Status: AC
  Administered 2021-09-07: 4 via RESPIRATORY_TRACT
  Filled 2021-09-07: qty 6.7

## 2021-09-07 MED ORDER — PREDNISONE 20 MG PO TABS: 60.0000 mg | ORAL_TABLET | Freq: Every day | ORAL | 0 refills | Status: AC

## 2021-09-07 NOTE — ED Provider Notes (Signed)
[facility]-EMERGENCY DEPT Provider Note   CSN: [id number] Arrival date & time: 09/07/21  1644     History Chief Complaint  Patient presents with   Shortness of Breath   Theresa Landry is a 87 y.o. female.  She has longstanding breathing problems and history of asthma.  She has been feeling increased shortness of breath since yesterday.  EMS found her to be wheezing and rhonchi in all fields and start her on albuterol and Atrovent.  She also received Solu-Medrol and magnesium.  She noticed she was having a little bit of left-sided chest pain.  While of her symptoms seem to resolved and she feels back to baseline.  No leg swelling.  The history is provided by the patient.  Shortness of Breath Severity:  Moderate Onset quality:  Gradual Duration:  2 days Timing:  Constant Progression:  Worsening Chronicity:  Recurrent Relieved by:  Nothing Worsened by:  Activity and coughing Ineffective treatments:  None tried Associated symptoms: chest pain, cough and wheezing   Associated symptoms: no abdominal pain, no fever, no headaches, no hemoptysis, no neck pain, no rash and no sore throat   Risk factors: no tobacco use       Past Medical History:  Diagnosis Date   Arthritis    Asthma     Patient Active Problem List   Diagnosis Date Noted   Cerebral hemorrhage (HCC) 07/10/2020   Aphasia    CVA (cerebral vascular accident) (HCC) 07/09/2020   Acute asthma exacerbation 05/21/2019   Adjustment disorder with anxious mood 02/25/2018   Essential hypertension 02/25/2018   GERD (gastroesophageal reflux disease) 02/25/2018    Past Surgical History:  Procedure Laterality Date   LOOP RECORDER INSERTION N/A 07/11/2020   Procedure: LOOP RECORDER INSERTION;  Surgeon: Regan Lemming, MD;  Location: [facility] INVASIVE CV LAB;  Service: Cardiovascular;  Laterality: N/A;   TONSILLECTOMY       OB History   No obstetric history on file.     Family History  Problem  Relation Age of Onset   Heart disease Mother    Stroke Father     Social History   Tobacco Use   Smoking status: Former   Smokeless tobacco: Never  Substance Use Topics   Alcohol use: Yes   Drug use: Never    Home Medications Prior to Admission medications   Medication Sig Start Date End Date Taking? Authorizing Provider  albuterol (VENTOLIN HFA) 108 (90 Base) MCG/ACT inhaler Inhale 2 puffs into the lungs every 6 (six) hours as needed. 06/09/20   [provider]  amLODipine (NORVASC) 5 MG tablet Take 1 tablet (5 mg total) by mouth daily. 07/16/20   Lauro Franklin, MD  aspirin EC 81 MG EC tablet Take 1 tablet (81 mg total) by mouth daily. Swallow whole. 07/16/20   Lauro Franklin, MD  atorvastatin (LIPITOR) 80 MG tablet Take 1 tablet (80 mg total) by mouth daily. 07/16/20   Lauro Franklin, MD  donepezil (ARICEPT) 10 MG tablet Take 10 mg by mouth daily. 04/26/20   [provider]  escitalopram (LEXAPRO) 20 MG tablet Take 20 mg by mouth daily. 04/26/20   [provider]  fluticasone (FLONASE) 50 MCG/ACT nasal spray Place 2 sprays into both nostrils daily. 04/26/20   [provider]  levothyroxine (SYNTHROID) 25 MCG tablet Take 25 mcg by mouth every morning. 01/16/20   [provider]  meclizine (ANTIVERT) 25 MG tablet Take 1 tablet (25 mg total) by  mouth 3 (three) times daily as needed for dizziness. 05/22/19   Dartha Lodge, PA-C  montelukast (SINGULAIR) 10 MG tablet Take 10 mg by mouth daily. 01/16/20   [provider]  pantoprazole (PROTONIX) 20 MG tablet Take 20 mg by mouth daily. 01/17/20   [provider]  SYMBICORT 160-4.5 MCG/ACT inhaler Inhale 2 puffs into the lungs 2 (two) times daily. 03/05/20   [provider]    Allergies    Patient has no known allergies.  Review of Systems   Review of Systems  Constitutional:  Negative for fever.  HENT:  Negative for sore throat.   Eyes:  Negative for visual  disturbance.  Respiratory:  Positive for cough, shortness of breath and wheezing. Negative for hemoptysis.   Cardiovascular:  Positive for chest pain.  Gastrointestinal:  Negative for abdominal pain.  Genitourinary:  Negative for dysuria.  Musculoskeletal:  Negative for neck pain.  Skin:  Negative for rash.  Neurological:  Negative for headaches.   Physical Exam Updated Vital Signs BP (!) 157/74 (BP Location: Right Arm)   Pulse 81   Temp 98.6 F (37 C) (Oral)   Resp 16   Ht 5' (1.524 m)   Wt 46.3 kg   SpO2 95%   BMI 19.92 kg/m   Physical Exam Vitals and nursing note reviewed.  Constitutional:      General: She is not in acute distress.    Appearance: She is well-developed.  HENT:     Head: Normocephalic and atraumatic.  Eyes:     Conjunctiva/sclera: Conjunctivae normal.  Cardiovascular:     Rate and Rhythm: Normal rate and regular rhythm.     Heart sounds: No murmur heard. Pulmonary:     Effort: Pulmonary effort is normal. No respiratory distress.     Breath sounds: Normal breath sounds.  Chest:     Chest wall: No tenderness.  Abdominal:     Palpations: Abdomen is soft.     Tenderness: There is no abdominal tenderness.  Musculoskeletal:        General: Normal range of motion.     Cervical back: Neck supple.     Right lower leg: No tenderness.     Left lower leg: No tenderness.  Skin:    General: Skin is warm and dry.     Capillary Refill: Capillary refill takes less than 2 seconds.  Neurological:     General: No focal deficit present.     Mental Status: She is alert.    ED Results / Procedures / Treatments   Labs (all labs ordered are listed, but only abnormal results are displayed) Labs Reviewed  BASIC METABOLIC PANEL - Abnormal; Notable for the following components:      Result Value   Glucose, Bld 131 (*)    All other components within normal limits  CBC WITH DIFFERENTIAL/PLATELET - Abnormal; Notable for the following components:   WBC 13.7 (*)     RBC 3.30 (*)    Hemoglobin 10.9 (*)    HCT 32.8 (*)    Neutro Abs 11.1 (*)    All other components within normal limits  TROPONIN I (HIGH SENSITIVITY)  TROPONIN I (HIGH SENSITIVITY)    EKG EKG Interpretation  Date/Time:  Saturday September 07 2021 17:37:29 EDT Ventricular Rate:  79 PR Interval:  224 QRS Duration: 83 QT Interval:  452 QTC Calculation: 519 R Axis:   141 Text Interpretation: Sinus or ectopic atrial rhythm Prolonged PR interval Probable lateral infarct, age indeterminate  Prolonged QT interval difuse t wave flattening compared with prior 5/22 Confirmed by Meridee Score [phone]) on 09/07/2021 5:58:35 PM  Radiology CT HEAD WO CONTRAST ( )  Result Date: 09/08/2021 CLINICAL DATA:  Moderate to severe trauma.  Fall. EXAM: CT HEAD WITHOUT CONTRAST TECHNIQUE: Contiguous axial images were obtained from the base of the skull through the vertex without intravenous contrast. COMPARISON:  January 10, 2021 FINDINGS: Brain: No subdural, epidural, or subarachnoid hemorrhage identified. Ventricles and sulci are stable and mildly prominent. Cerebellum, brainstem, and basal cisterns are normal. No mass effect or midline shift. Mild white matter changes are identified. No acute cortical ischemia or infarct. Vascular: Calcified atherosclerosis is seen in the intracranial carotids. Skull: Normal. Negative for fracture or focal lesion. Sinuses/Orbits: Mucosal thickening is seen throughout scattered ethmoid air cells and peripherally in the maxillary sinuses bilaterally. Mild sinus disease in the sphenoid sinuses. Mastoid air cells and middle ears are well aerated on the right. The left middle ear is well aerated. Opacification inferior left mastoid air cells is stable. Other: There is a hematoma over the right forehead. IMPRESSION: 1. Hematoma over the right scalp. No acute intracranial abnormalities or hemorrhage. 2. Sinus disease as above. Electronically Signed   By: Gerome Sam III M.D.   On:  09/08/2021 07:12   DG Chest Port 1 View  Result Date: 09/07/2021 CLINICAL DATA:  Shortness of breath.  Wheezing. EXAM: PORTABLE CHEST 1 VIEW COMPARISON:  07/09/2020 FINDINGS: Loop recorder overlies the cardiac apex. There is mild perihilar peribronchial thickening. No focal consolidations or pleural effusions. No pulmonary edema. IMPRESSION: No evidence for acute focal pulmonary abnormality. Mild bronchitic changes. Electronically Signed   By: Norva Pavlov M.D.   On: 09/07/2021 17:52    Procedures Procedures   Medications Ordered in ED Medications  albuterol (VENTOLIN HFA) 108 (90 Base) MCG/ACT inhaler 4 puff (4 puffs Inhalation Given 09/07/21 1849)    ED Course  I have reviewed the triage vital signs and the nursing notes.  Pertinent labs & imaging results that were available during my care of the patient were reviewed by me and considered in my medical decision making (see chart for details).  Clinical Course as of 09/08/21 2297  Sat Sep 07, 2021  2036 Patient states she feels back to baseline and wants to be discharged.  Her sats are borderline.  She said she would follow-up with her primary care doctor. [MB]    Clinical Course User Index [MB] Terrilee Files, MD   MDM Rules/Calculators/A&P                          This patient complains of shortness of breath left-sided chest pain; this involves an extensive number of treatment Options and is a complaint that carries with it a high risk of complications and Morbidity. The differential includes COPD, asthma, pneumothorax, pneumonia, COVID, ACS, dehydration  I ordered, reviewed and interpreted labs, which included CBC with elevated white count, hemoglobin slightly lower than priors, chemistries fairly normal other than elevated glucose, initial troponin flat I ordered medication albuterol MDI.  Patient had already received DuoNeb, Solu-Medrol, magnesium by EMS I ordered imaging studies which included chest x-ray and I  independently    visualized and interpreted imaging which showed no acute findings Additional history obtained from EMS Previous records obtained and reviewed in epic no recent admissions  After the interventions stated above, I reevaluated the patient and found patient be symptomatically improved.  Patient had  a trending pulse oximetry pulse ox dropped to 88 to 89%.  She feels that she is at her baseline right now.  Given breathing treatment.  Wanted to have patient stick around for delta troponin and further management.  Patient states she is at her baseline and is asking to be discharged.  She has a companion here who wishes to drive her home.  She states she will follow-up with her primary care doctor.  We discussed her clinical situation here and she feels she would be able to manage at home.  Return instructions discussed.   Final Clinical Impression(s) / ED Diagnoses Final diagnoses:  Exacerbation of asthma, unspecified asthma severity, unspecified whether persistent    Rx / DC Orders ED Discharge Orders     None        Terrilee Files, MD 09/08/21 [phone]

## 2021-09-07 NOTE — Discharge Instructions (Addendum)
You were seen in the emergency department for shortness of breath.  Your x-ray did not show any pneumonia and your lab work was reassuring.  Your symptoms improved after some medication.  Please continue to use your inhaler and nebulizer as needed.  We are prescribing you 4 more days of prednisone.  Contact your primary care doctor for close follow-up.  Return to the emergency department for any worsening or concerning symptoms

## 2021-09-07 NOTE — ED Triage Notes (Signed)
[facility] transferred pt from home and reports the following. Upon EMS arrival, pt SOB, wheezing and rhonchi in all fields. Started 1 duoneb albuterol 5 mg and Atrovent 0.5 mg. Pt reports acute onset of SOB after coughing today. Also, pt reporting left flank pain and anxiety. During transport EMS administered solumedrol 125 mg and magnesium sulfate 2 g. Pt reports feeling better.

## 2021-09-08 ENCOUNTER — Encounter (HOSPITAL_COMMUNITY): Payer: Self-pay

## 2021-09-08 ENCOUNTER — Emergency Department (HOSPITAL_COMMUNITY): Payer: Medicare Other

## 2021-09-08 ENCOUNTER — Emergency Department (HOSPITAL_COMMUNITY)
Admission: EM | Admit: 2021-09-08 | Discharge: 2021-09-08 | Disposition: A | Discharge status: Home / Self Care | Payer: Medicare Other | Attending: Emergency Medicine | Admitting: Emergency Medicine

## 2021-09-08 DIAGNOSIS — Y93E5 Activity, floor mopping and cleaning: Secondary | ICD-10-CM | POA: Insufficient documentation

## 2021-09-08 DIAGNOSIS — J45909 Unspecified asthma, uncomplicated: Secondary | ICD-10-CM | POA: Insufficient documentation

## 2021-09-08 DIAGNOSIS — I10 Essential (primary) hypertension: Secondary | ICD-10-CM | POA: Diagnosis not present

## 2021-09-08 DIAGNOSIS — W19XXXA Unspecified fall, initial encounter: Secondary | ICD-10-CM

## 2021-09-08 DIAGNOSIS — T148XXA Other injury of unspecified body region, initial encounter: Secondary | ICD-10-CM

## 2021-09-08 DIAGNOSIS — W01198A Fall on same level from slipping, tripping and stumbling with subsequent striking against other object, initial encounter: Secondary | ICD-10-CM | POA: Insufficient documentation

## 2021-09-08 DIAGNOSIS — S0083XA Contusion of other part of head, initial encounter: Secondary | ICD-10-CM | POA: Insufficient documentation

## 2021-09-08 DIAGNOSIS — Z79899 Other long term (current) drug therapy: Secondary | ICD-10-CM | POA: Diagnosis not present

## 2021-09-08 DIAGNOSIS — Y92009 Unspecified place in unspecified non-institutional (private) residence as the place of occurrence of the external cause: Secondary | ICD-10-CM | POA: Diagnosis not present

## 2021-09-08 DIAGNOSIS — S0990XA Unspecified injury of head, initial encounter: Secondary | ICD-10-CM | POA: Diagnosis present

## 2021-09-08 DIAGNOSIS — Z7951 Long term (current) use of inhaled steroids: Secondary | ICD-10-CM | POA: Diagnosis not present

## 2021-09-08 DIAGNOSIS — Z7982 Long term (current) use of aspirin: Secondary | ICD-10-CM | POA: Insufficient documentation

## 2021-09-08 DIAGNOSIS — Z87891 Personal history of nicotine dependence: Secondary | ICD-10-CM | POA: Insufficient documentation

## 2021-09-08 NOTE — Discharge Instructions (Addendum)
We saw you in the ER after you had a fall. °All the imaging results are normal, no fractures seen. No evidence of brain bleed. °Please be very careful with walking, and do everything possible to prevent falls. ° ° °

## 2021-09-08 NOTE — ED Provider Notes (Signed)
Emergency Medicine Provider Triage Evaluation Note  Theresa Landry , a 87 y.o. female  was evaluated in triage.  Pt complains of fall PTA. Slipped and fell with no LOC. Not on chronic anticoagulation. No medications PTA.  Review of Systems  Positive: Fall, hematoma Negative: LOC, N/V  Physical Exam  BP (!) 162/70 (BP Location: Right Arm)   Pulse 63   Temp 98.7 F (37.1 C) (Oral)   Resp 16   SpO2 92%  Gen:   Awake, no distress   Resp:  Normal effort  MSK:   Moves extremities without difficulty  Other:  Hematoma to right frontal scalp. No cervical midline TTP. Grips 5/5 strength in BUE. Normal strength against resistance in all major muscle groups b/l.  Medical Decision Making  Medically screening exam initiated at 3:32 AM.  Appropriate orders placed.  Maha Fischel was informed that the remainder of the evaluation will be completed by another provider, this initial triage assessment does not replace that evaluation, and the importance of remaining in the ED until their evaluation is complete.  Hematoma    Antony Madura, PA-C 09/08/21 [id number]    Shon Baton, MD 09/09/21 [phone]

## 2021-09-08 NOTE — ED Triage Notes (Signed)
The pt arrived by gems from home  she fell not on blood thinners  she has swelling to her rt forehead  the pt had just been seen at [facility] earlier for some difficulty breathing  then she returned home and fell. alert

## 2021-09-08 NOTE — ED Notes (Signed)
Pt's son called as soon as pt was about to leave & advised this RN that she will be picked & up & he will be called family to come get her. Pt is A/Ox4, verbal, ambulatory & waiting in the ED waiting room for her ride to come get her.

## 2021-09-08 NOTE — ED Notes (Signed)
All contacts in this pt's chart was called for her to be picked up d/t being up for D/C, nobody has answered, voicemail has been left with a return number to call ED & speak to this RN. Pt has no cell phone or house key & no one is at her house to let her in, she does not know any phone numbers by memory. Charge RN made aware.

## 2021-09-08 NOTE — ED Provider Notes (Signed)
[facility] EMERGENCY DEPARTMENT Provider Note   CSN: [id number] Arrival date & time: 09/08/21  0316     History Chief Complaint  Patient presents with   Marletta Lor    Presleigh Feldstein is a 87 y.o. female.  HPI     Pt comes in with cc of fall. She had a fall 2 days ago. She lost balance while cleaning her place. Y'DXA, she was seen at [facility] and discharged. Pt is not on any blood thinners. She is reporting slight pain over her forehead. No numbness, tingling, weakness. Walks well normally and lives independently with care aides. Not on blood thinners.  Denies CP, dib, neck pain, leg pain.    Past Medical History:  Diagnosis Date   Arthritis    Asthma     Patient Active Problem List   Diagnosis Date Noted   Cerebral hemorrhage (HCC) 07/10/2020   Aphasia    CVA (cerebral vascular accident) (HCC) 07/09/2020   Acute asthma exacerbation 05/21/2019   Adjustment disorder with anxious mood 02/25/2018   Essential hypertension 02/25/2018   GERD (gastroesophageal reflux disease) 02/25/2018    Past Surgical History:  Procedure Laterality Date   LOOP RECORDER INSERTION N/A 07/11/2020   Procedure: LOOP RECORDER INSERTION;  Surgeon: Regan Lemming, MD;  Location: [facility] INVASIVE CV LAB;  Service: Cardiovascular;  Laterality: N/A;   TONSILLECTOMY       OB History   No obstetric history on file.     Family History  Problem Relation Age of Onset   Heart disease Mother    Stroke Father     Social History   Tobacco Use   Smoking status: Former   Smokeless tobacco: Never  Substance Use Topics   Alcohol use: Yes   Drug use: Never    Home Medications Prior to Admission medications   Medication Sig Start Date End Date Taking? Authorizing Provider  albuterol (VENTOLIN HFA) 108 (90 Base) MCG/ACT inhaler Inhale 2 puffs into the lungs every 6 (six) hours as needed. 06/09/20   [provider]  amLODipine (NORVASC) 5 MG tablet Take 1 tablet (5 mg total) by  mouth daily. 07/16/20   Lauro Franklin, MD  aspirin EC 81 MG EC tablet Take 1 tablet (81 mg total) by mouth daily. Swallow whole. 07/16/20   Lauro Franklin, MD  atorvastatin (LIPITOR) 80 MG tablet Take 1 tablet (80 mg total) by mouth daily. 07/16/20   Lauro Franklin, MD  donepezil (ARICEPT) 10 MG tablet Take 10 mg by mouth daily. 04/26/20   [provider]  escitalopram (LEXAPRO) 20 MG tablet Take 20 mg by mouth daily. 04/26/20   [provider]  fluticasone (FLONASE) 50 MCG/ACT nasal spray Place 2 sprays into both nostrils daily. 04/26/20   [provider]  levothyroxine (SYNTHROID) 25 MCG tablet Take 25 mcg by mouth every morning. 01/16/20   [provider]  meclizine (ANTIVERT) 25 MG tablet Take 1 tablet (25 mg total) by mouth 3 (three) times daily as needed for dizziness. 05/22/19   Dartha Lodge, PA-C  montelukast (SINGULAIR) 10 MG tablet Take 10 mg by mouth daily. 01/16/20   [provider]  pantoprazole (PROTONIX) 20 MG tablet Take 20 mg by mouth daily. 01/17/20   [provider]  predniSONE (DELTASONE) 20 MG tablet Take 3 tablets (60 mg total) by mouth daily. 09/07/21   Terrilee Files, MD  SYMBICORT 160-4.5 MCG/ACT inhaler Inhale 2 puffs into the lungs 2 (two) times daily.  03/05/20   [provider]    Allergies    Patient has no known allergies.  Review of Systems   Review of Systems  Constitutional:  Positive for activity change.  Respiratory:  Negative for shortness of breath.   Cardiovascular:  Negative for chest pain.  Neurological:  Negative for dizziness.  Hematological:  Bruises/bleeds easily.  All other systems reviewed and are negative.  Physical Exam Updated Vital Signs BP (!) 131/107   Pulse (!) 59   Temp 98.7 F (37.1 C) (Oral)   Resp 20   SpO2 96%   Physical Exam Vitals and nursing note reviewed.  Constitutional:      Appearance: She is well-developed.  HENT:     Head:     Comments:  Hematoma to the R side of the forehead Cardiovascular:     Rate and Rhythm: Normal rate.  Pulmonary:     Effort: Pulmonary effort is normal.  Musculoskeletal:     Cervical back: Normal range of motion and neck supple.  Skin:    General: Skin is warm and dry.     Findings: Bruising and erythema present.  Neurological:     Mental Status: She is alert and oriented to person, place, and time.    ED Results / Procedures / Treatments   Labs (all labs ordered are listed, but only abnormal results are displayed) Labs Reviewed - No data to display  EKG None  Radiology CT HEAD WO CONTRAST ( )  Result Date: 09/08/2021 CLINICAL DATA:  Moderate to severe trauma.  Fall. EXAM: CT HEAD WITHOUT CONTRAST TECHNIQUE: Contiguous axial images were obtained from the base of the skull through the vertex without intravenous contrast. COMPARISON:  January 10, 2021 FINDINGS: Brain: No subdural, epidural, or subarachnoid hemorrhage identified. Ventricles and sulci are stable and mildly prominent. Cerebellum, brainstem, and basal cisterns are normal. No mass effect or midline shift. Mild white matter changes are identified. No acute cortical ischemia or infarct. Vascular: Calcified atherosclerosis is seen in the intracranial carotids. Skull: Normal. Negative for fracture or focal lesion. Sinuses/Orbits: Mucosal thickening is seen throughout scattered ethmoid air cells and peripherally in the maxillary sinuses bilaterally. Mild sinus disease in the sphenoid sinuses. Mastoid air cells and middle ears are well aerated on the right. The left middle ear is well aerated. Opacification inferior left mastoid air cells is stable. Other: There is a hematoma over the right forehead. IMPRESSION: 1. Hematoma over the right scalp. No acute intracranial abnormalities or hemorrhage. 2. Sinus disease as above. Electronically Signed   By: Gerome Sam III M.D.   On: 09/08/2021 07:12   DG Chest Port 1 View  Result Date:  09/07/2021 CLINICAL DATA:  Shortness of breath.  Wheezing. EXAM: PORTABLE CHEST 1 VIEW COMPARISON:  07/09/2020 FINDINGS: Loop recorder overlies the cardiac apex. There is mild perihilar peribronchial thickening. No focal consolidations or pleural effusions. No pulmonary edema. IMPRESSION: No evidence for acute focal pulmonary abnormality. Mild bronchitic changes. Electronically Signed   By: Norva Pavlov M.D.   On: 09/07/2021 17:52    Procedures Procedures   Medications Ordered in ED Medications - No data to display  ED Course  I have reviewed the triage vital signs and the nursing notes.  Pertinent labs & imaging results that were available during my care of the patient were reviewed by me and considered in my medical decision making (see chart for details).    MDM Rules/Calculators/A&P  DDx includes: - Mechanical falls - ICH - Fractures - Contusions - Soft tissue injury  Mechanical fall with isolated head injury. Ct head is neg for acute bleed or skull fractures. Moving all 4 extremity. Gross ms exam didn't reveal any other injuries. Will ambulate - if able then stable for d/c.   Final Clinical Impression(s) / ED Diagnoses Final diagnoses:  Hematoma  Contusion of face, initial encounter  Fall, initial encounter    Rx / DC Orders ED Discharge Orders     None        Derwood Kaplan, MD 09/08/21 1213

## 2021-09-19 ENCOUNTER — Ambulatory Visit (INDEPENDENT_AMBULATORY_CARE_PROVIDER_SITE_OTHER): Payer: Medicare Other

## 2021-09-19 DIAGNOSIS — I639 Cerebral infarction, unspecified: Secondary | ICD-10-CM

## 2021-09-19 LAB — CUP PACEART REMOTE DEVICE CHECK
Date Time Interrogation Session: 20221013040846
Implantable Pulse Generator Implant Date: 20210804

## 2021-09-27 NOTE — Progress Notes (Signed)
Carelink Summary Report / Loop Recorder 

## 2021-11-21 ENCOUNTER — Ambulatory Visit (INDEPENDENT_AMBULATORY_CARE_PROVIDER_SITE_OTHER): Payer: Medicare Other

## 2021-11-21 DIAGNOSIS — I639 Cerebral infarction, unspecified: Secondary | ICD-10-CM | POA: Diagnosis not present

## 2021-11-25 LAB — CUP PACEART REMOTE DEVICE CHECK
Date Time Interrogation Session: 20221218041146
Implantable Pulse Generator Implant Date: 20210804

## 2021-12-03 NOTE — Progress Notes (Signed)
Carelink Summary Report / Loop Recorder 

## 2021-12-27 ENCOUNTER — Ambulatory Visit (INDEPENDENT_AMBULATORY_CARE_PROVIDER_SITE_OTHER): Payer: Medicare Other

## 2021-12-27 DIAGNOSIS — I639 Cerebral infarction, unspecified: Secondary | ICD-10-CM | POA: Diagnosis not present

## 2021-12-27 LAB — CUP PACEART REMOTE DEVICE CHECK
Date Time Interrogation Session: 20230120041107
Implantable Pulse Generator Implant Date: 20210804

## 2022-01-09 NOTE — Progress Notes (Signed)
Carelink Summary Report / Loop Recorder 

## 2022-01-20 ENCOUNTER — Emergency Department (HOSPITAL_COMMUNITY): Payer: Medicare Other

## 2022-01-20 ENCOUNTER — Emergency Department (HOSPITAL_COMMUNITY)
Admission: EM | Admit: 2022-01-20 | Discharge: 2022-01-20 | Disposition: A | Discharge status: Home / Self Care | Payer: Medicare Other | Attending: Emergency Medicine | Admitting: Emergency Medicine

## 2022-01-20 ENCOUNTER — Other Ambulatory Visit: Payer: Self-pay

## 2022-01-20 ENCOUNTER — Encounter (HOSPITAL_COMMUNITY): Payer: Self-pay | Admitting: Emergency Medicine

## 2022-01-20 DIAGNOSIS — S301XXA Contusion of abdominal wall, initial encounter: Secondary | ICD-10-CM | POA: Insufficient documentation

## 2022-01-20 DIAGNOSIS — Z79899 Other long term (current) drug therapy: Secondary | ICD-10-CM | POA: Diagnosis not present

## 2022-01-20 DIAGNOSIS — S3991XA Unspecified injury of abdomen, initial encounter: Secondary | ICD-10-CM | POA: Diagnosis present

## 2022-01-20 DIAGNOSIS — R55 Syncope and collapse: Secondary | ICD-10-CM

## 2022-01-20 DIAGNOSIS — I10 Essential (primary) hypertension: Secondary | ICD-10-CM | POA: Insufficient documentation

## 2022-01-20 DIAGNOSIS — X58XXXA Exposure to other specified factors, initial encounter: Secondary | ICD-10-CM | POA: Insufficient documentation

## 2022-01-20 DIAGNOSIS — D649 Anemia, unspecified: Secondary | ICD-10-CM

## 2022-01-20 DIAGNOSIS — F0390 Unspecified dementia without behavioral disturbance: Secondary | ICD-10-CM | POA: Diagnosis not present

## 2022-01-20 DIAGNOSIS — J45909 Unspecified asthma, uncomplicated: Secondary | ICD-10-CM | POA: Diagnosis not present

## 2022-01-20 LAB — CBC WITH DIFFERENTIAL/PLATELET
Abs Immature Granulocytes: 0.01 10*3/uL (ref 0.00–0.07)
Basophils Absolute: 0.1 10*3/uL (ref 0.0–0.1)
Basophils Relative: 1 %
Eosinophils Absolute: 0.4 10*3/uL (ref 0.0–0.5)
Eosinophils Relative: 5 %
HCT: 28.5 % — ABNORMAL LOW (ref 36.0–46.0)
Hemoglobin: 9.4 g/dL — ABNORMAL LOW (ref 12.0–15.0)
Immature Granulocytes: 0 %
Lymphocytes Relative: 29 %
Lymphs Abs: 2.2 10*3/uL (ref 0.7–4.0)
MCH: 34.4 pg — ABNORMAL HIGH (ref 26.0–34.0)
MCHC: 33 g/dL (ref 30.0–36.0)
MCV: 104.4 fL — ABNORMAL HIGH (ref 80.0–100.0)
Monocytes Absolute: 0.7 10*3/uL (ref 0.1–1.0)
Monocytes Relative: 8 %
Neutro Abs: 4.5 10*3/uL (ref 1.7–7.7)
Neutrophils Relative %: 57 %
Platelets: 181 10*3/uL (ref 150–400)
RBC: 2.73 MIL/uL — ABNORMAL LOW (ref 3.87–5.11)
RDW: 13.8 % (ref 11.5–15.5)
WBC: 7.8 10*3/uL (ref 4.0–10.5)
nRBC: 0 % (ref 0.0–0.2)

## 2022-01-20 LAB — URINALYSIS, ROUTINE W REFLEX MICROSCOPIC
Bilirubin Urine: NEGATIVE
Glucose, UA: NEGATIVE mg/dL
Hgb urine dipstick: NEGATIVE
Ketones, ur: 5 mg/dL — AB
Leukocytes,Ua: NEGATIVE
Nitrite: NEGATIVE
Protein, ur: NEGATIVE mg/dL
Specific Gravity, Urine: 1.018 (ref 1.005–1.030)
pH: 9 — ABNORMAL HIGH (ref 5.0–8.0)

## 2022-01-20 LAB — COMPREHENSIVE METABOLIC PANEL
ALT: 13 U/L (ref 0–44)
AST: 22 U/L (ref 15–41)
Albumin: 3.3 g/dL — ABNORMAL LOW (ref 3.5–5.0)
Alkaline Phosphatase: 53 U/L (ref 38–126)
Anion gap: 6 (ref 5–15)
BUN: 14 mg/dL (ref 8–23)
CO2: 27 mmol/L (ref 22–32)
Calcium: 8.7 mg/dL — ABNORMAL LOW (ref 8.9–10.3)
Chloride: 104 mmol/L (ref 98–111)
Creatinine, Ser: 0.67 mg/dL (ref 0.44–1.00)
GFR, Estimated: >60 mL/min (ref >60)
Glucose, Bld: 98 mg/dL (ref 70–99)
Potassium: 3.7 mmol/L (ref 3.5–5.1)
Sodium: 137 mmol/L (ref 135–145)
Total Bilirubin: 0.8 mg/dL (ref 0.3–1.2)
Total Protein: 6.6 g/dL (ref 6.5–8.1)

## 2022-01-20 LAB — POC OCCULT BLOOD, ED: Fecal Occult Bld: NEGATIVE

## 2022-01-20 LAB — CBG MONITORING, ED: Glucose-Capillary: 101 mg/dL — ABNORMAL HIGH (ref 70–99)

## 2022-01-20 MED ORDER — IOHEXOL 300 MG/ML  SOLN
100.0000 mL | Freq: Once | INTRAMUSCULAR | Status: AC | PRN
  Administered 2022-01-20: 100 mL via INTRAVENOUS

## 2022-01-20 MED ORDER — SODIUM CHLORIDE 0.9 % IV BOLUS
500.0000 mL | Freq: Once | INTRAVENOUS | Status: AC
  Administered 2022-01-20: 500 mL via INTRAVENOUS

## 2022-01-20 NOTE — Discharge Instructions (Signed)
Your evaluation today is overall been reassuring, we did note a drop in your red blood cell count.  We do not see signs of bleeding today, please follow-up with your primary care doctor for a recheck of this.  Make sure you are drinking plenty of fluids and eating regular meals.  Follow-up closely with your doctor but please return to the hospital for any new or worsening symptoms.

## 2022-01-20 NOTE — ED Notes (Signed)
Called and let pt's son know that pt left her phone in the room, he will call and let her neighbor who took her home know [facility] ED has her phone.

## 2022-01-20 NOTE — ED Notes (Signed)
Ambulated to RR and back w/ one assist for safety.

## 2022-01-20 NOTE — ED Provider Notes (Signed)
[facility] DEPT Provider Note   CSN: [id number] Arrival date & time: 01/20/22  1539     History  No chief complaint on file.   Theresa Landry is a 88 y.o. female.  Theresa Landry is a 88 y.o. female with a history of hypertension, GERD, asthma, arthritis, dementia, who presents to the emergency department via EMS for evaluation of near syncopal episode.  Patient reports she was standing in her kitchen and started to feel lightheaded, was helped to a chair and she reports symptoms improved, she still currently feels a bit lightheaded.  Reports some associated nausea and feeling weak.  No vomiting, no abdominal pain.  She has not had any recent vomiting or diarrhea, no fevers or chills.  She denies any associated chest pain, palpitations or shortness of breath, before or after this episode.  Denies headache, current dizziness or room spinning sensation.  Reports she has not had much to eat or drink today, does report having some scotch while she watched the Super Bowl last night.  Additional history is provided by a family friend who helps to watch over her as her children are not located locally, he reports that she lives alone but has nurse aides that come for a few hours in the morning and evening, was recently in [place] with her son's family for several weeks and recently returned home, had been off of her medications for a few days but got back on them over the weekend.  He reports that the patient over served herself with scotch last night and was not feeling well yesterday evening.  Then today got into an argument over the phone with her daughter-in-law and then had an episode of dizziness and called EMS.  Question whether this may have been because she got worked up over the phone.  When EMS arrived patient was alert, oriented, sitting in chair with normal vitals.  Patient reports that she did not ever fall to the ground or hit her head.  She is not on  blood thinners.  The history is provided by the patient, the EMS personnel and a friend.      Home Medications Prior to Admission medications   Medication Sig Start Date End Date Taking? Authorizing Provider  albuterol (VENTOLIN HFA) 108 (90 Base) MCG/ACT inhaler Inhale 2 puffs into the lungs every 6 (six) hours as needed. 06/09/20   [provider]  amLODipine (NORVASC) 5 MG tablet Take 1 tablet (5 mg total) by mouth daily. 07/16/20   Briant Cedar, MD  aspirin EC 81 MG EC tablet Take 1 tablet (81 mg total) by mouth daily. Swallow whole. 07/16/20   Briant Cedar, MD  atorvastatin (LIPITOR) 80 MG tablet Take 1 tablet (80 mg total) by mouth daily. 07/16/20   Briant Cedar, MD  donepezil (ARICEPT) 10 MG tablet Take 10 mg by mouth daily. 04/26/20   [provider]  escitalopram (LEXAPRO) 20 MG tablet Take 20 mg by mouth daily. 04/26/20   [provider]  fluticasone (FLONASE) 50 MCG/ACT nasal spray Place 2 sprays into both nostrils daily. 04/26/20   [provider]  levothyroxine (SYNTHROID) 25 MCG tablet Take 25 mcg by mouth every morning. 01/16/20   [provider]  meclizine (ANTIVERT) 25 MG tablet Take 1 tablet (25 mg total) by mouth 3 (three) times daily as needed for dizziness. 05/22/19   Jacqlyn Larsen, PA-C  montelukast (SINGULAIR) 10 MG tablet Take 10 mg by mouth daily. 01/16/20  [provider]  pantoprazole (PROTONIX) 20 MG tablet Take 20 mg by mouth daily. 01/17/20   [provider]  predniSONE (DELTASONE) 20 MG tablet Take 3 tablets (60 mg total) by mouth daily. 09/07/21   Hayden Rasmussen, MD  SYMBICORT 160-4.5 MCG/ACT inhaler Inhale 2 puffs into the lungs 2 (two) times daily. 03/05/20   [provider]      Allergies    Patient has no known allergies.    Review of Systems   Review of Systems  Constitutional:  Negative for chills and fever.  HENT: Negative.    Respiratory:  Negative for cough and  shortness of breath.   Cardiovascular:  Negative for chest pain.  Gastrointestinal:  Positive for nausea. Negative for abdominal pain, blood in stool, diarrhea and vomiting.  Genitourinary:  Negative for dysuria and frequency.  Musculoskeletal:  Negative for arthralgias and myalgias.  Skin:  Negative for color change and wound.  Neurological:  Positive for light-headedness. Negative for dizziness, syncope, weakness, numbness and headaches.  All other systems reviewed and are negative.  Physical Exam Updated Vital Signs BP (!) 154/55 (BP Location: Right Arm)    Pulse (!) 53    Temp 97.8 F (36.6 C) (Oral)    Resp 18    SpO2 100%  Physical Exam Vitals and nursing note reviewed.  Constitutional:      General: She is not in acute distress.    Appearance: Normal appearance. She is well-developed. She is not diaphoretic.     Comments: Elderly female, alert, oriented, mildly agitated and requesting to go home  HENT:     Head: Normocephalic and atraumatic.     Comments: No hematoma, step-off, deformity, negative battle sign, no signs of head trauma    Mouth/Throat:     Mouth: Mucous membranes are moist.     Pharynx: Oropharynx is clear.  Eyes:     General:        Right eye: No discharge.        Left eye: No discharge.     Extraocular Movements: Extraocular movements intact.     Pupils: Pupils are equal, round, and reactive to light.  Cardiovascular:     Rate and Rhythm: Normal rate and regular rhythm.     Pulses: Normal pulses.     Heart sounds: Normal heart sounds. No murmur heard.   No friction rub. No gallop.  Pulmonary:     Effort: Pulmonary effort is normal. No respiratory distress.     Breath sounds: Normal breath sounds. No wheezing or rales.     Comments: Respirations equal and unlabored, patient able to speak in full sentences, lungs clear to auscultation bilaterally  Abdominal:     General: Bowel sounds are normal. There is no distension.     Palpations: Abdomen is soft.  There is no mass.     Tenderness: There is no abdominal tenderness. There is no guarding.     Comments: Abdomen soft, nondistended, nontender to palpation in all quadrants without guarding or peritoneal signs  Musculoskeletal:        General: No deformity.     Cervical back: Neck supple. No tenderness.     Right lower leg: No edema.     Left lower leg: No edema.     Comments: Nontender bruising noted over the right flank and back extending into the buttock  Skin:    General: Skin is warm and dry.     Capillary Refill: Capillary refill takes less  than 2 seconds.  Neurological:     Mental Status: She is alert and oriented to person, place, and time.     Coordination: Coordination normal.     Comments: Speech is clear, able to follow commands CN III-XII intact Normal strength in upper and lower extremities bilaterally including dorsiflexion and plantar flexion, strong and equal grip strength Sensation normal to light and sharp touch Moves extremities without ataxia, coordination intact  Psychiatric:        Mood and Affect: Mood normal.        Behavior: Behavior normal.    ED Results / Procedures / Treatments   Labs (all labs ordered are listed, but only abnormal results are displayed) Labs Reviewed  URINALYSIS, ROUTINE W REFLEX MICROSCOPIC - Abnormal; Notable for the following components:      Result Value   pH 9.0 (*)    Ketones, ur 5 (*)    All other components within normal limits  COMPREHENSIVE METABOLIC PANEL - Abnormal; Notable for the following components:   Calcium 8.7 (*)    Albumin 3.3 (*)    All other components within normal limits  CBC WITH DIFFERENTIAL/PLATELET - Abnormal; Notable for the following components:   RBC 2.73 (*)    Hemoglobin 9.4 (*)    HCT 28.5 (*)    MCV 104.4 (*)    MCH 34.4 (*)    All other components within normal limits  CBG MONITORING, ED - Abnormal; Notable for the following components:   Glucose-Capillary 101 (*)    All other components  within normal limits  POC OCCULT BLOOD, ED    EKG EKG Interpretation  Date/Time:  Monday January 20 2022 15:58:26 EST Ventricular Rate:  54 PR Interval:    QRS Duration: 107 QT Interval:  476 QTC Calculation: 452 R Axis:   59 Text Interpretation: Atrial fibrillation Low voltage, precordial leads Confirmed by Regan Lemming ([id number]) on 01/20/2022 4:16:11 PM  Radiology CT ABDOMEN PELVIS W CONTRAST  Result Date: 01/20/2022 CLINICAL DATA:  Syncope 1 hour ago, lightheadedness, nausea EXAM: CT ABDOMEN AND PELVIS WITH CONTRAST TECHNIQUE: Multidetector CT imaging of the abdomen and pelvis was performed using the standard protocol following bolus administration of intravenous contrast. RADIATION DOSE REDUCTION: This exam was performed according to the departmental dose-optimization program which includes automated exposure control, adjustment of the mA and/or kV according to patient size and/or use of iterative reconstruction technique. CONTRAST:  1105mL OMNIPAQUE IOHEXOL 300 MG/ML  SOLN COMPARISON:  04/08/2021 FINDINGS: Lower chest: No acute pleural or parenchymal lung disease. Hepatobiliary: No focal liver abnormality is seen. No gallstones, gallbladder wall thickening, or biliary dilatation. Pancreas: Unremarkable. No pancreatic ductal dilatation or surrounding inflammatory changes. Spleen: Normal in size without focal abnormality. Adrenals/Urinary Tract: Adrenal glands are unremarkable. Kidneys are normal, without renal calculi, focal lesion, or hydronephrosis. Bladder is unremarkable. Stomach/Bowel: No bowel obstruction or ileus. Diffuse colonic diverticulosis without evidence of diverticulitis. No bowel wall thickening or inflammatory change. Vascular/Lymphatic: Aortic atherosclerosis. No enlarged abdominal or pelvic lymph nodes. Reproductive: Endometrial thickening versus fluid within the endometrial cavity again noted, measuring up to 2.4 cm in thickness, abnormal in a patient of this age. Follow-up  outpatient pelvic ultrasound recommended if not previously performed. No adnexal masses. Other: No free fluid or free gas. No abdominal wall hernia. No evidence of retroperitoneal hemorrhage. Musculoskeletal: No acute or destructive bony lesions. Reconstructed images demonstrate no additional findings. IMPRESSION: 1. No acute intra-abdominal or intrapelvic process. No evidence of retroperitoneal hemorrhage. 2. Colonic diverticulosis without  diverticulitis. 3. Stable endometrial thickening versus fluid in the endometrial cavity, abnormal in a patient of this age. Follow-up nonemergent outpatient pelvic ultrasound again recommended if not previously performed. 4.  Aortic Atherosclerosis (ICD10-I70.0). Electronically Signed   By: Randa Ngo M.D.   On: 01/20/2022 20:39   DG Chest Port 1 View  Result Date: 01/20/2022 CLINICAL DATA:  Near syncopal episode 1 hour ago, lightheaded, nauseous, dizzy, weak, no loss of consciousness 6 field EXAM: PORTABLE CHEST 1 VIEW COMPARISON:  Portable exam 1630 hours compared to 11/19/2021 FINDINGS: Normal heart size, mediastinal contours, and pulmonary vascularity. Atherosclerotic calcification aorta. Minimal LEFT basilar atelectasis. Tiny calcified granuloma RIGHT upper lobe. Lungs otherwise clear. No pulmonary infiltrate, pleural effusion, or pneumothorax. Bones demineralized. IMPRESSION: Minimal LEFT basilar atelectasis. Aortic Atherosclerosis (ICD10-I70.0). Electronically Signed   By: Lavonia Dana M.D.   On: 01/20/2022 16:43    Procedures Procedures    Medications Ordered in ED Medications  sodium chloride 0.9 % bolus 500 mL (0 mLs Intravenous Stopped 01/20/22 2130)  iohexol (OMNIPAQUE) 300 MG/ML solution 100 mL (100 mLs Intravenous Contrast Given 01/20/22 2020)    ED Course/ Medical Decision Making/ A&P                           This patient presents to the ED for concern of near syncope, this involves an extensive number of treatment options, and is a complaint  that carries with it a high risk of complications and morbidity.  The differential diagnosis includes dehydration, decreased intake, arrhythmia, ACS, PE, infection, alcohol use   Co morbidities that complicate the patient evaluation  Dementia Hypertension   Additional history obtained:  Additional history obtained from family friend, daughter-in-law External records from outside source obtained and reviewed including prior ED encounters and outpatient PCP notes   Lab Tests:  I Ordered, and personally interpreted labs.  The pertinent results include: No leukocytosis, patient did have a drop in her hemoglobin,Today it is 9.4, 4 months ago was 10.9 but prior to that it was typically around 12.  Negative Hemoccult.  No significant electrolyte derangements, normal renal and liver function.  UA without signs of infection.  Normal CBG.   Imaging Studies ordered:  I ordered imaging studies including chest x-ray, CT abdomen pelvis given bruising and drop in hemoglobin, to assess for retroperitoneal hematoma I independently visualized and interpreted imaging which showed no evidence of pneumonia or other active cardiopulmonary disease.  No evidence of retroperitoneal hemorrhage or other acute abnormalities on abdominal CT. I agree with the radiologist interpretation   Cardiac Monitoring:  The patient was maintained on a cardiac monitor.  I personally viewed and interpreted the cardiac monitored which showed an underlying rhythm of: NSR   Medicines ordered and prescription drug management:  I ordered medication including IV fluid bolus for lightheadedness Reevaluation of the patient after these medicines showed that the patient improved I have reviewed the patients home medicines and have made adjustments as needed   Problem List / ED Course:  Patient presents with episode of lightheadedness, no syncope, has had some decreased appetite per recent PCP visits and had alcohol last night  which may have contributed to the symptoms.  Here patient seems to be back at her baseline, does have underlying history of dementia.  Family friend at bedside reports that her mental status seems to wax and wane some, she does better with structure when she has nursing aides with her.  Overall seems  to be at baseline. Work-up today has been reassuring, no signs of infection.  Does have a drop in her hemoglobin, this appears to have been chronic over the past few months though, negative Hemoccult and no signs of intra-abdominal, retroperitoneal hemorrhage.  No leukocytosis, normal renal function, does not suggest significant dehydration. Patient becoming agitated and very much would like to go home, her work-up today has been reassuring and she has been able to ambulate in the ED today without any lightheadedness or near syncopal symptoms.  I called and discussed today's work-up with her daughter-in-law who is in agreement that patient can return home, she will have nursing aides at her home tonight when she returns.  Her family friend will be available to transport her home.   Reevaluation:  After the interventions noted above, I reevaluated the patient and found that they have :improved   Social Determinants of Health:  Dementia, lives alone with part-time aides   Dispostion:  After consideration of the diagnostic results and the patients response to treatment, I feel that the patent would benefit from discharge home with close outpatient follow-up and continued monitoring.  Plan discussed with patient as well as with family friend and patient's daughter-in-law who are in agreement..          Final Clinical Impression(s) / ED Diagnoses Final diagnoses:  None    Rx / DC Orders ED Discharge Orders     None         Janet Berlin 01/21/22 1202    Regan Lemming, MD 01/21/22 2108

## 2022-01-20 NOTE — ED Triage Notes (Signed)
Patient BIBA from home d/t near syncopal episode approx 1 hour ago. Patient did not fall or have LOC. She reports feeling lightheaded, nauseous dizzy and weak.  BP 159/73 HR 68 SpO2 100% RA  CBG 101  20 G LF

## 2022-01-29 ENCOUNTER — Ambulatory Visit (INDEPENDENT_AMBULATORY_CARE_PROVIDER_SITE_OTHER): Payer: Medicare Other

## 2022-01-29 DIAGNOSIS — I639 Cerebral infarction, unspecified: Secondary | ICD-10-CM | POA: Diagnosis not present

## 2022-01-30 LAB — CUP PACEART REMOTE DEVICE CHECK
Date Time Interrogation Session: 20230222040744
Implantable Pulse Generator Implant Date: 20210804

## 2022-02-05 NOTE — Progress Notes (Signed)
Carelink Summary Report / Loop Recorder 

## 2022-03-03 ENCOUNTER — Ambulatory Visit (INDEPENDENT_AMBULATORY_CARE_PROVIDER_SITE_OTHER): Payer: Medicare Other

## 2022-03-03 DIAGNOSIS — I639 Cerebral infarction, unspecified: Secondary | ICD-10-CM | POA: Diagnosis not present

## 2022-03-04 LAB — CUP PACEART REMOTE DEVICE CHECK
Date Time Interrogation Session: 20230327040631
Implantable Pulse Generator Implant Date: 20210804

## 2022-03-14 NOTE — Progress Notes (Signed)
Carelink Summary Report / Loop Recorder 

## 2022-04-07 ENCOUNTER — Ambulatory Visit (INDEPENDENT_AMBULATORY_CARE_PROVIDER_SITE_OTHER): Payer: Medicare Other

## 2022-04-07 DIAGNOSIS — I639 Cerebral infarction, unspecified: Secondary | ICD-10-CM | POA: Diagnosis not present

## 2022-04-07 LAB — CUP PACEART REMOTE DEVICE CHECK
Date Time Interrogation Session: 20230429040912
Implantable Pulse Generator Implant Date: 20210804

## 2022-04-22 NOTE — Progress Notes (Signed)
Carelink Summary Report / Loop Recorder 

## 2022-05-12 ENCOUNTER — Ambulatory Visit (INDEPENDENT_AMBULATORY_CARE_PROVIDER_SITE_OTHER): Payer: Medicare Other

## 2022-05-12 DIAGNOSIS — I639 Cerebral infarction, unspecified: Secondary | ICD-10-CM

## 2022-05-13 LAB — CUP PACEART REMOTE DEVICE CHECK
Date Time Interrogation Session: 20230606093912
Implantable Pulse Generator Implant Date: 20210804

## 2022-05-25 ENCOUNTER — Other Ambulatory Visit: Payer: Self-pay

## 2022-05-25 ENCOUNTER — Emergency Department (HOSPITAL_COMMUNITY)
Admission: EM | Admit: 2022-05-25 | Discharge: 2022-05-25 | Disposition: A | Discharge status: Home / Self Care | Payer: Medicare Other | Attending: Emergency Medicine | Admitting: Emergency Medicine

## 2022-05-25 ENCOUNTER — Encounter (HOSPITAL_COMMUNITY): Payer: Self-pay | Admitting: Radiology

## 2022-05-25 ENCOUNTER — Emergency Department (HOSPITAL_COMMUNITY): Payer: Medicare Other

## 2022-05-25 DIAGNOSIS — S0990XA Unspecified injury of head, initial encounter: Secondary | ICD-10-CM | POA: Diagnosis present

## 2022-05-25 DIAGNOSIS — F0390 Unspecified dementia without behavioral disturbance: Secondary | ICD-10-CM | POA: Diagnosis not present

## 2022-05-25 DIAGNOSIS — Z7982 Long term (current) use of aspirin: Secondary | ICD-10-CM | POA: Insufficient documentation

## 2022-05-25 DIAGNOSIS — M542 Cervicalgia: Secondary | ICD-10-CM | POA: Insufficient documentation

## 2022-05-25 DIAGNOSIS — W1812XA Fall from or off toilet with subsequent striking against object, initial encounter: Secondary | ICD-10-CM | POA: Insufficient documentation

## 2022-05-25 DIAGNOSIS — S0003XA Contusion of scalp, initial encounter: Secondary | ICD-10-CM | POA: Diagnosis not present

## 2022-05-25 DIAGNOSIS — Y92002 Bathroom of unspecified non-institutional (private) residence single-family (private) house as the place of occurrence of the external cause: Secondary | ICD-10-CM | POA: Insufficient documentation

## 2022-05-25 DIAGNOSIS — Z7901 Long term (current) use of anticoagulants: Secondary | ICD-10-CM | POA: Diagnosis not present

## 2022-05-25 DIAGNOSIS — W19XXXA Unspecified fall, initial encounter: Secondary | ICD-10-CM

## 2022-05-25 LAB — BASIC METABOLIC PANEL
Anion gap: 10 (ref 5–15)
BUN: 9 mg/dL (ref 8–23)
CO2: 25 mmol/L (ref 22–32)
Calcium: 9 mg/dL (ref 8.9–10.3)
Chloride: 102 mmol/L (ref 98–111)
Creatinine, Ser: 0.75 mg/dL (ref 0.44–1.00)
GFR, Estimated: >60 mL/min (ref >60)
Glucose, Bld: 84 mg/dL (ref 70–99)
Potassium: 3.3 mmol/L — ABNORMAL LOW (ref 3.5–5.1)
Sodium: 137 mmol/L (ref 135–145)

## 2022-05-25 LAB — CBC WITH DIFFERENTIAL/PLATELET
Abs Immature Granulocytes: 0.02 10*3/uL (ref 0.00–0.07)
Basophils Absolute: 0.1 10*3/uL (ref 0.0–0.1)
Basophils Relative: 1 %
Eosinophils Absolute: 1.6 10*3/uL — ABNORMAL HIGH (ref 0.0–0.5)
Eosinophils Relative: 16 %
HCT: 33.9 % — ABNORMAL LOW (ref 36.0–46.0)
Hemoglobin: 11.4 g/dL — ABNORMAL LOW (ref 12.0–15.0)
Immature Granulocytes: 0 %
Lymphocytes Relative: 32 %
Lymphs Abs: 3.1 10*3/uL (ref 0.7–4.0)
MCH: 34.4 pg — ABNORMAL HIGH (ref 26.0–34.0)
MCHC: 33.6 g/dL (ref 30.0–36.0)
MCV: 102.4 fL — ABNORMAL HIGH (ref 80.0–100.0)
Monocytes Absolute: 0.8 10*3/uL (ref 0.1–1.0)
Monocytes Relative: 8 %
Neutro Abs: 4.3 10*3/uL (ref 1.7–7.7)
Neutrophils Relative %: 43 %
Platelets: 216 10*3/uL (ref 150–400)
RBC: 3.31 MIL/uL — ABNORMAL LOW (ref 3.87–5.11)
RDW: 13.8 % (ref 11.5–15.5)
WBC: 9.9 10*3/uL (ref 4.0–10.5)
nRBC: 0 % (ref 0.0–0.2)

## 2022-05-25 LAB — URINALYSIS, ROUTINE W REFLEX MICROSCOPIC
Bilirubin Urine: NEGATIVE
Glucose, UA: NEGATIVE mg/dL
Hgb urine dipstick: NEGATIVE
Ketones, ur: NEGATIVE mg/dL
Leukocytes,Ua: NEGATIVE
Nitrite: NEGATIVE
Protein, ur: NEGATIVE mg/dL
Specific Gravity, Urine: 1.003 — ABNORMAL LOW (ref 1.005–1.030)
pH: 6 (ref 5.0–8.0)

## 2022-05-25 NOTE — ED Provider Notes (Signed)
[facility] EMERGENCY DEPARTMENT Provider Note   CSN: [id number] Arrival date & time: 05/25/22  1745     History  Chief Complaint  Patient presents with   Theresa Landry is a 88 y.o. female.  HPI     88 year old female comes in with chief complaint of fall. Patient has history of mild dementia.  She had a mechanical fall while at her friend's place.  Patient states that she was in the bathroom, lost her balance and fell backwards.  She is having some pain in the back of her head and neck.  She denies any chest pain, shortness of breath, lower extremity pain.  Per EMS, she also has left-sided skin tear.  Patient lives by herself.  She is on Plavix for   Home Medications Prior to Admission medications   Medication Sig Start Date End Date Taking? Authorizing Provider  albuterol (VENTOLIN HFA) 108 (90 Base) MCG/ACT inhaler Inhale 2 puffs into the lungs every 6 (six) hours as needed. 06/09/20   [provider]  amLODipine (NORVASC) 5 MG tablet Take 1 tablet (5 mg total) by mouth daily. 07/16/20   Lauro Franklin, MD  aspirin EC 81 MG EC tablet Take 1 tablet (81 mg total) by mouth daily. Swallow whole. 07/16/20   Lauro Franklin, MD  atorvastatin (LIPITOR) 80 MG tablet Take 1 tablet (80 mg total) by mouth daily. 07/16/20   Lauro Franklin, MD  donepezil (ARICEPT) 10 MG tablet Take 10 mg by mouth daily. 04/26/20   [provider]  escitalopram (LEXAPRO) 20 MG tablet Take 20 mg by mouth daily. 04/26/20   [provider]  fluticasone (FLONASE) 50 MCG/ACT nasal spray Place 2 sprays into both nostrils daily. 04/26/20   [provider]  levothyroxine (SYNTHROID) 25 MCG tablet Take 25 mcg by mouth every morning. 01/16/20   [provider]  meclizine (ANTIVERT) 25 MG tablet Take 1 tablet (25 mg total) by mouth 3 (three) times daily as needed for dizziness. 05/22/19   Dartha Lodge, PA-C  montelukast (SINGULAIR) 10  MG tablet Take 10 mg by mouth daily. 01/16/20   [provider]  pantoprazole (PROTONIX) 20 MG tablet Take 20 mg by mouth daily. 01/17/20   [provider]  predniSONE (DELTASONE) 20 MG tablet Take 3 tablets (60 mg total) by mouth daily. 09/07/21   Terrilee Files, MD  SYMBICORT 160-4.5 MCG/ACT inhaler Inhale 2 puffs into the lungs 2 (two) times daily. 03/05/20   [provider]      Allergies    Patient has no known allergies.    Review of Systems   Review of Systems  All other systems reviewed and are negative.   Physical Exam Updated Vital Signs BP 135/69   Pulse (!) 56   Temp 97.8 F (36.6 C)   Resp 16   Ht 5' (1.524 m)   Wt 46.3 kg   SpO2 99%   BMI 19.92 kg/m  Physical Exam Vitals and nursing note reviewed.  Constitutional:      Appearance: She is well-developed.  HENT:     Head: Atraumatic.     Comments: Scalp hematoma posteriorly Eyes:     Extraocular Movements: Extraocular movements intact.     Pupils: Pupils are equal, round, and reactive to light.  Neck:     Comments: In a c-collar, midline lower C-spine tenderness Cardiovascular:     Rate and Rhythm: Normal rate.  Pulmonary:  Effort: Pulmonary effort is normal.  Musculoskeletal:     Comments: Head to toe evaluation shows no hematoma, bleeding of the scalp, no facial abrasions, no spine step offs, crepitus of the chest or neck, no tenderness to palpation of the bilateral upper and lower extremities, no gross deformities, no chest tenderness, no pelvic pain.   Skin:    General: Skin is warm and dry.     Findings: Bruising present.  Neurological:     Mental Status: She is alert and oriented to person, place, and time.     ED Results / Procedures / Treatments   Labs (all labs ordered are listed, but only abnormal results are displayed) Labs Reviewed  BASIC METABOLIC PANEL - Abnormal; Notable for the following components:      Result Value   Potassium 3.3 (*)    All other  components within normal limits  CBC WITH DIFFERENTIAL/PLATELET - Abnormal; Notable for the following components:   RBC 3.31 (*)    Hemoglobin 11.4 (*)    HCT 33.9 (*)    MCV 102.4 (*)    MCH 34.4 (*)    Eosinophils Absolute 1.6 (*)    All other components within normal limits  URINALYSIS, ROUTINE W REFLEX MICROSCOPIC - Abnormal; Notable for the following components:   Color, Urine STRAW (*)    Specific Gravity, Urine 1.003 (*)    All other components within normal limits    EKG None  Radiology CT Head Wo Contrast  Result Date: 05/25/2022 CLINICAL DATA:  Fall, head trauma EXAM: CT HEAD WITHOUT CONTRAST TECHNIQUE: Contiguous axial images were obtained from the base of the skull through the vertex without intravenous contrast. RADIATION DOSE REDUCTION: This exam was performed according to the departmental dose-optimization program which includes automated exposure control, adjustment of the mA and/or kV according to patient size and/or use of iterative reconstruction technique. COMPARISON:  CT head 09/08/2021 FINDINGS: Brain: No acute intracranial hemorrhage, mass effect, or herniation. No extra-axial fluid collections. No evidence of acute territorial infarct. No hydrocephalus. Moderate to advanced cortical volume loss. Patchy hypodensities in the periventricular and subcortical white matter, likely secondary to chronic microvascular ischemic changes. Tiny old lacunar infarct in the left basal ganglia. Vascular: Calcified plaques in the carotid siphons. Skull: Normal. Negative for fracture or focal lesion. Sinuses/Orbits: Moderate to severe chronic mucosal thickening in the sinuses. Air-fluid levels in the sphenoid sinuses. Other: Soft tissue swelling in the bilateral occipital scalp. IMPRESSION: 1. No acute intracranial process identified. Chronic findings as described. 2. Soft tissue swelling of the bilateral occipital scalp. 3. Correlate for possible acute sphenoid sinusitis. Electronically  Signed   By: Jannifer Hick M.D.   On: 05/25/2022 18:57   CT Cervical Spine Wo Contrast  Result Date: 05/25/2022 CLINICAL DATA:  Neck trauma, fall EXAM: CT CERVICAL SPINE WITHOUT CONTRAST TECHNIQUE: Multidetector CT imaging of the cervical spine was performed without intravenous contrast. Multiplanar CT image reconstructions were also generated. RADIATION DOSE REDUCTION: This exam was performed according to the departmental dose-optimization program which includes automated exposure control, adjustment of the mA and/or kV according to patient size and/or use of iterative reconstruction technique. COMPARISON:  None Available. FINDINGS: Alignment: Grade 1 anterolisthesis of C2 on C3 trauma C4 on C5 and C5 on C6. Skull base and vertebrae: No acute fracture. No primary bone lesion or focal pathologic process. Soft tissues and spinal canal: No prevertebral fluid or swelling. No visible canal hematoma. Disc levels: Moderate to severe intervertebral disc space narrowing at C6-C7.  Advanced facet arthropathy. Prominent anterior endplate osteophytes noted. Upper chest: Acute process identified.  Biapical pleural thickening. Other: None. IMPRESSION: Chronic changes with no acute fracture or malalignment identified. Electronically Signed   By: Jannifer Hick M.D.   On: 05/25/2022 18:53   DG Chest Port 1 View  Result Date: 05/25/2022 CLINICAL DATA:  fall, eval for ptx EXAM: PORTABLE CHEST 1 VIEW COMPARISON:  Chest x-ray 01/20/2022, CT chest 12/13/2020 FINDINGS: Wireless cardiac device overlies the left chest. The heart and mediastinal contours are unchanged. Aortic calcification. No focal consolidation. No pulmonary edema. No pleural effusion. No pneumothorax. No acute osseous abnormality. IMPRESSION: 1. No active disease. 2. Aortic Atherosclerosis (ICD10-I70.0). Electronically Signed   By: Tish Frederickson M.D.   On: 05/25/2022 18:11    Procedures Procedures    Medications Ordered in ED Medications - No data  to display  ED Course/ Medical Decision Making/ A&P                           Medical Decision Making Amount and/or Complexity of Data Reviewed Labs: ordered. Radiology: ordered.   This patient presents to the ED with chief complaint(s) of fall with pertinent past medical history of dementia and Plavix use which further complicates the presenting complaint. The complaint involves an extensive differential diagnosis and also carries with it a high risk of complications and morbidity.    The differential diagnosis includes : DDx includes: - Mechanical fall due to balance issues - ICH - Fractures - Contusions - Soft tissue injury -Hematoma    The initial plan is to order appropriate imaging that include CT scan of the head, C-spine and x-ray to make sure there is no pneumothorax   Additional history obtained: Additional history obtained from EMS    Independent labs interpretation:  The following labs were independently interpreted: Patient has normal-appearing CBC, urine analysis and metabolic profile  Independent visualization of imaging: - I independently visualized the following imaging with scope of interpretation limited to determining acute life threatening conditions related to emergency care: CT scan of the brain, which revealed no evidence of brain bleed  Treatment and Reassessment: C-collar has been removed, patient has no neurologic symptoms with movement of her head.   Final Clinical Impression(s) / ED Diagnoses Final diagnoses:  Fall, initial encounter  Hematoma of scalp, initial encounter    Rx / DC Orders ED Discharge Orders     None         Derwood Kaplan, MD 05/25/22 2042

## 2022-05-25 NOTE — ED Triage Notes (Signed)
Pt was at a friends house in the bathroom and she slipped and fell backwards hitting her head on a bathtub. Pt is on Plavix and has two hematomas to the back of her head. Pt is complaining of cervical neck pain at this time. GCS 14 at this time, pt does have dementia at baseline oriented to situation and name and birthday only.

## 2022-05-25 NOTE — Progress Notes (Signed)
Orthopedic Tech Progress Note Patient Details:  Tarica Harl 19-Jun-1933 [id number]  Patient ID: Deneen Harts, female   DOB: 06-29-1933, 88 y.o.   MRN: [id number] Level II; not needed.  Darleen Crocker 05/25/2022, 7:07 PM

## 2022-05-25 NOTE — Discharge Instructions (Signed)
You are seen in the ER after you had a fall.  CT scan of your head and neck are reassuring.  You do have hematoma to your scalp, but no broken bones, no internal bleeding. Take Tylenol or over-the-counter medications for pain control.

## 2022-05-29 NOTE — Progress Notes (Signed)
Carelink Summary Report / Loop Recorder 

## 2022-06-16 ENCOUNTER — Ambulatory Visit (INDEPENDENT_AMBULATORY_CARE_PROVIDER_SITE_OTHER): Payer: Medicare Other

## 2022-06-16 DIAGNOSIS — I639 Cerebral infarction, unspecified: Secondary | ICD-10-CM

## 2022-06-16 LAB — CUP PACEART REMOTE DEVICE CHECK
Date Time Interrogation Session: 20230705143739
Implantable Pulse Generator Implant Date: 20210804

## 2022-07-14 NOTE — Progress Notes (Signed)
Carelink Summary Report / Loop Recorder 

## 2022-07-17 LAB — CUP PACEART REMOTE DEVICE CHECK
Date Time Interrogation Session: 20230806040722
Implantable Pulse Generator Implant Date: 20210804

## 2022-08-25 ENCOUNTER — Ambulatory Visit (INDEPENDENT_AMBULATORY_CARE_PROVIDER_SITE_OTHER): Payer: Medicare Other

## 2022-08-25 DIAGNOSIS — I639 Cerebral infarction, unspecified: Secondary | ICD-10-CM

## 2022-08-26 LAB — CUP PACEART REMOTE DEVICE CHECK
Date Time Interrogation Session: 20230919132729
Implantable Pulse Generator Implant Date: 20210804

## 2022-09-09 NOTE — Progress Notes (Signed)
Carelink Summary Report / Loop Recorder 

## 2022-11-16 IMAGING — CT CT CERVICAL SPINE W/O CM
4 series · 15 of 33 positions shown, 18 images · non-contrast
Comparison: None Available.

CLINICAL DATA: Neck trauma, fall



[Series 4: c_spine 2.0 orthogonals · axial · 0.24mm/px · z∈[-219,-117]mm · 5 of 80 slices shown, 7 images]
[im 14/80  soft-tissue]
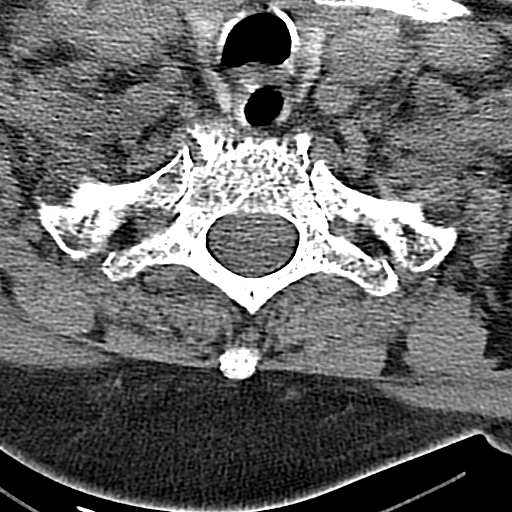
[im 14/80  bone]
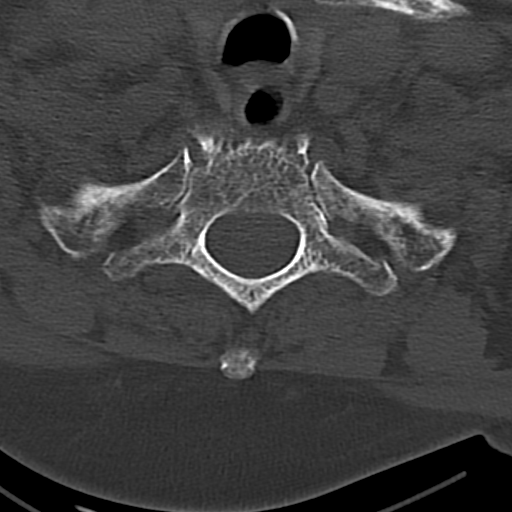
[im 27/80  bone]
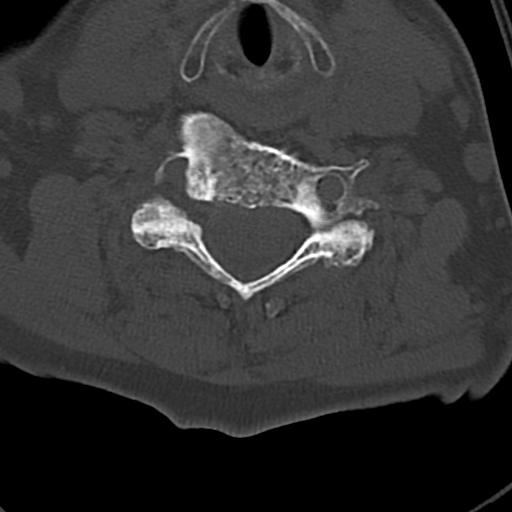
[im 40/80  bone]
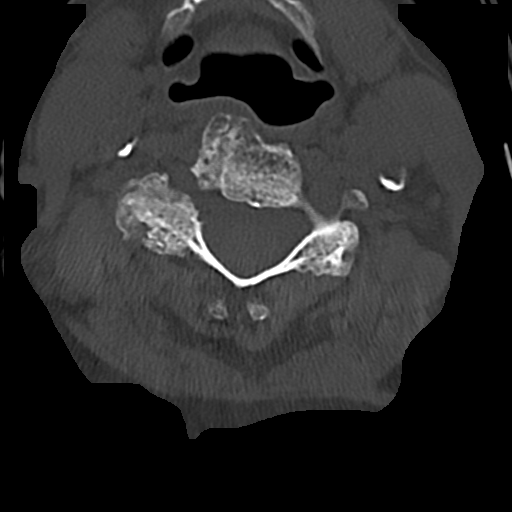
[im 53/80  bone]
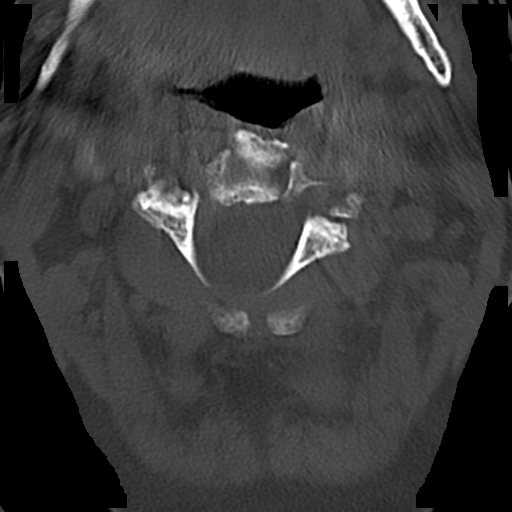
[im 66/80  soft-tissue]
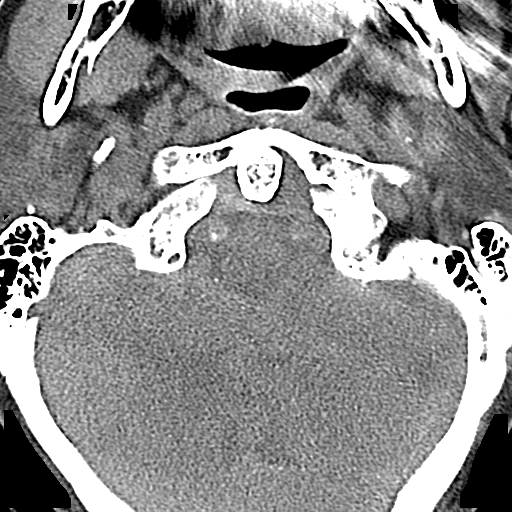
[im 66/80  bone]
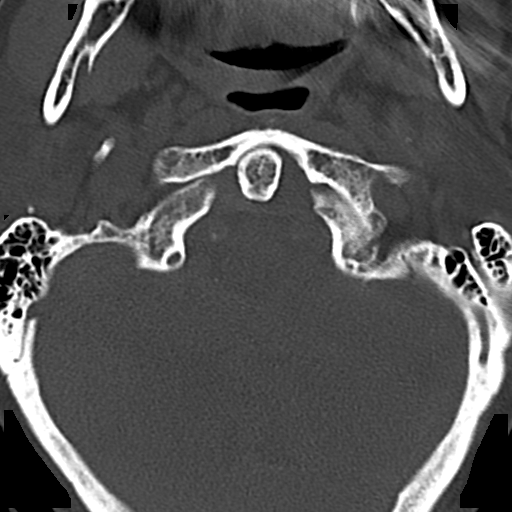

[Series 7: c_spine 2.0 sag bone · sagittal · 0.35mm/px · 5 of 49 slices shown, 6 images]
[im 17/49  bone]
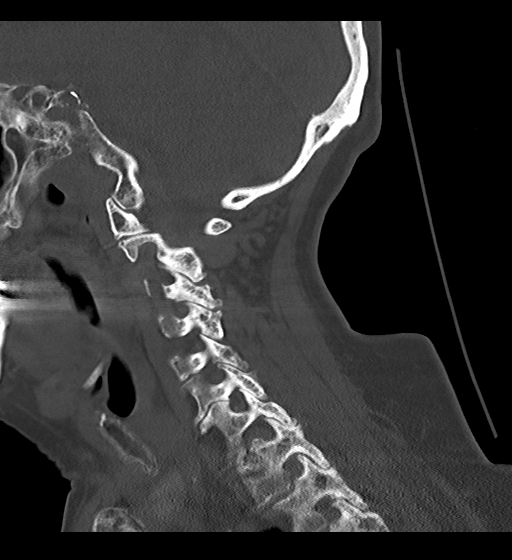
[im 21/49  bone]
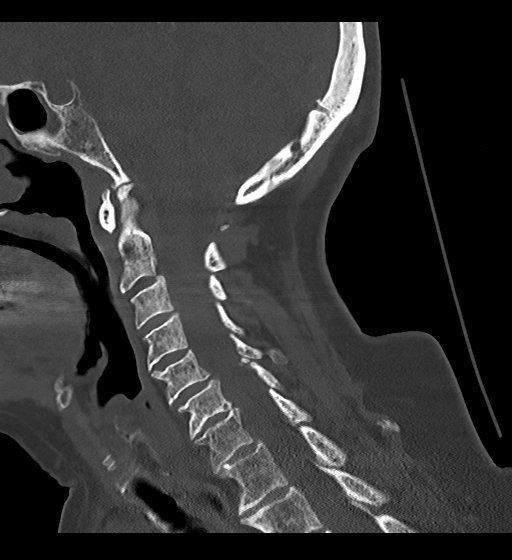
[im 25/49  soft-tissue]
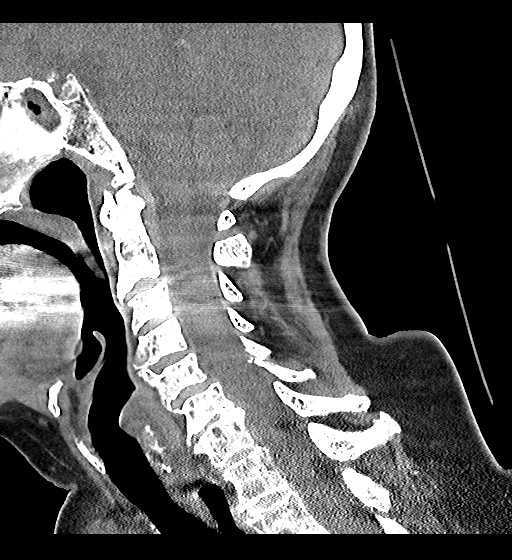
[im 25/49  bone]
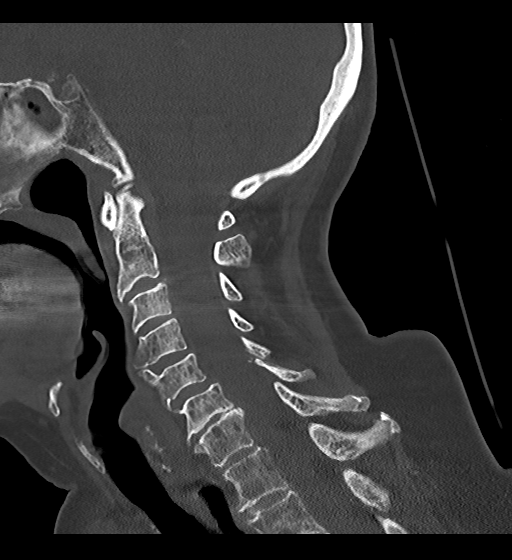
[im 29/49  bone]
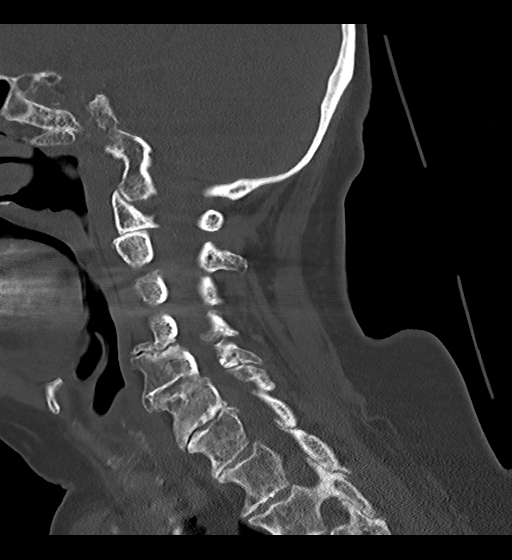
[im 33/49  bone]
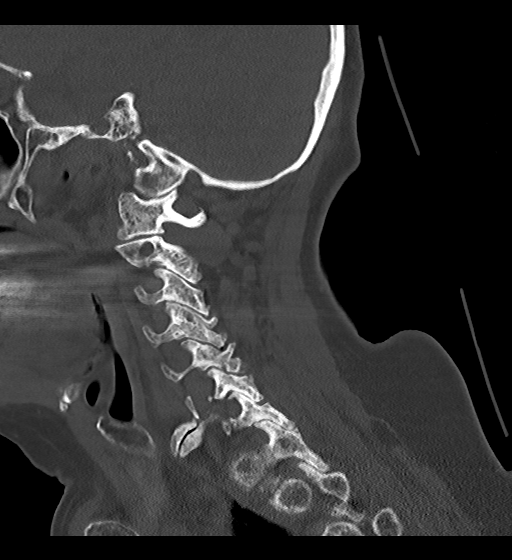

[Series 8: c_spine 2.0 cor bone · coronal · 0.34mm/px · 3 of 51 slices shown]
[im 11/51  bone]
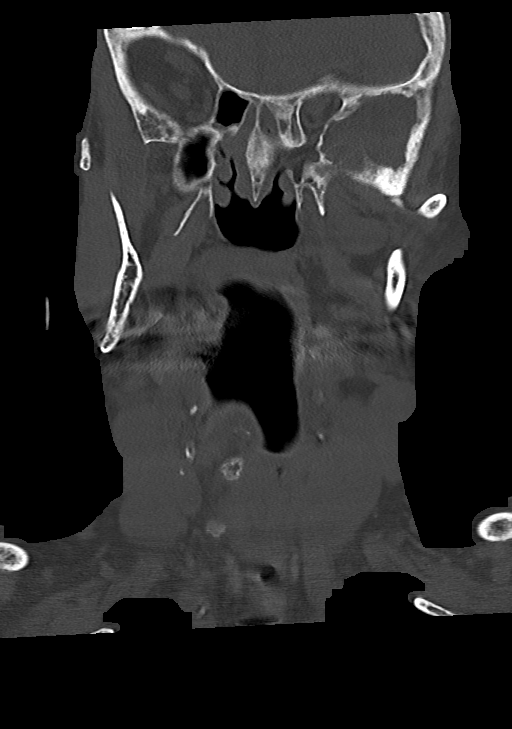
[im 21/51  bone]
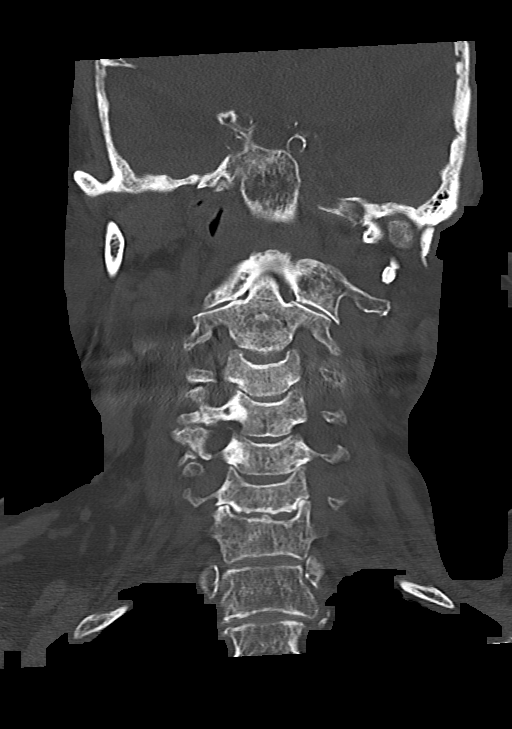
[im 31/51  bone]
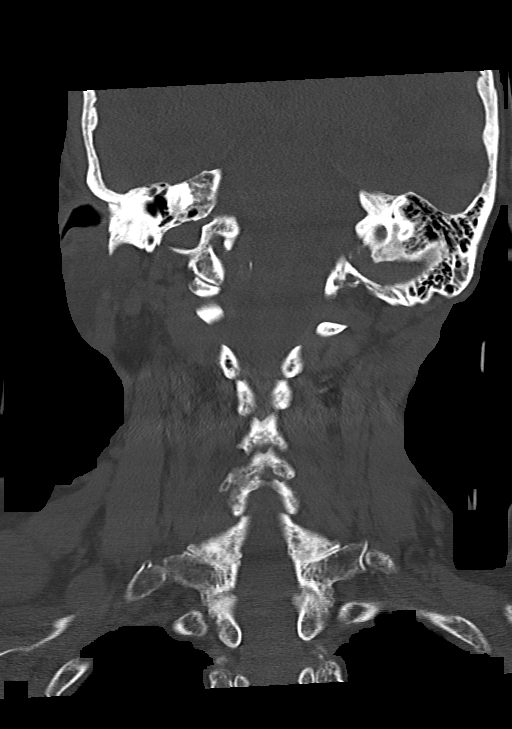

[Series 11: c_spine 2.0 st · axial · 0.40mm/px · z∈[-192,-162]mm · 2 of 90 slices shown]
[im 15/90  bone]
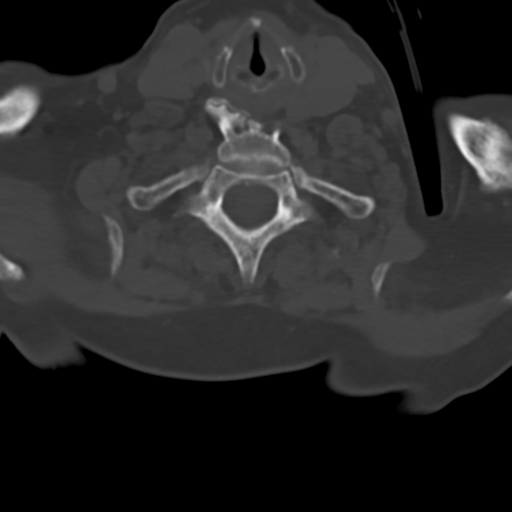
[im 30/90  bone]
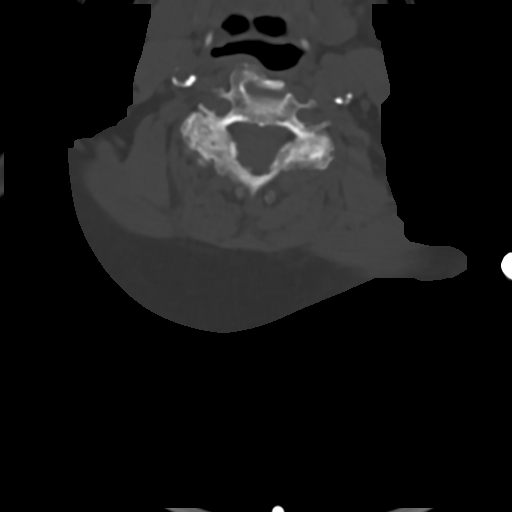

[15 of 33 positions shown; findings below may reference images not displayed]

FINDINGS: Alignment: Grade 1 anterolisthesis of C2 on C3 trauma C4 on C5 and
C5 on C6.

Skull base and vertebrae: No acute fracture. No primary bone lesion
or focal pathologic process.

Soft tissues and spinal canal: No prevertebral fluid or swelling. No
visible canal hematoma.

Disc levels: Moderate to severe intervertebral disc space narrowing
at C6-C7. Advanced facet arthropathy. Prominent anterior endplate
osteophytes noted.

Upper chest: Acute process identified.  Biapical pleural thickening.

Other: None.
IMPRESSION: Chronic changes with no acute fracture or malalignment identified.
